# Patient Record
Sex: Female | Born: 1935 | Race: White | Hispanic: No | Smoking: Never smoker
Health system: Southern US, Community
[De-identification: ages and names within clinical notes are randomized; demographics above are authoritative.]

## PROBLEM LIST (undated history)

## (undated) DIAGNOSIS — I10 Essential (primary) hypertension: Secondary | ICD-10-CM

## (undated) DIAGNOSIS — E785 Hyperlipidemia, unspecified: Secondary | ICD-10-CM

## (undated) DIAGNOSIS — H4450 Unspecified degenerated conditions of globe: Secondary | ICD-10-CM

## (undated) HISTORY — PX: EYE SURGERY: SHX253

---

## 2003-05-08 ENCOUNTER — Ambulatory Visit (HOSPITAL_COMMUNITY): Admission: RE | Admit: 2003-05-08 | Discharge: 2003-05-08 | Payer: Self-pay | Admitting: Ophthalmology

## 2007-12-14 ENCOUNTER — Encounter (INDEPENDENT_AMBULATORY_CARE_PROVIDER_SITE_OTHER): Payer: Self-pay | Admitting: Family Medicine

## 2007-12-14 ENCOUNTER — Ambulatory Visit: Payer: Self-pay | Admitting: Cardiology

## 2007-12-14 ENCOUNTER — Ambulatory Visit (HOSPITAL_COMMUNITY): Admission: RE | Admit: 2007-12-14 | Discharge: 2007-12-14 | Payer: Self-pay | Admitting: Family Medicine

## 2010-11-03 ENCOUNTER — Encounter: Payer: Self-pay | Admitting: *Deleted

## 2010-11-03 ENCOUNTER — Emergency Department (HOSPITAL_COMMUNITY)
Admission: EM | Admit: 2010-11-03 | Discharge: 2010-11-03 | Disposition: A | Discharge status: Home / Self Care | Payer: Medicare Other | Attending: Emergency Medicine | Admitting: Emergency Medicine

## 2010-11-03 ENCOUNTER — Emergency Department (HOSPITAL_COMMUNITY): Payer: Medicare Other

## 2010-11-03 DIAGNOSIS — M25539 Pain in unspecified wrist: Secondary | ICD-10-CM | POA: Insufficient documentation

## 2010-11-03 DIAGNOSIS — I10 Essential (primary) hypertension: Secondary | ICD-10-CM | POA: Insufficient documentation

## 2010-11-03 DIAGNOSIS — S62109A Fracture of unspecified carpal bone, unspecified wrist, initial encounter for closed fracture: Secondary | ICD-10-CM

## 2010-11-03 DIAGNOSIS — Z79899 Other long term (current) drug therapy: Secondary | ICD-10-CM | POA: Insufficient documentation

## 2010-11-03 DIAGNOSIS — W19XXXA Unspecified fall, initial encounter: Secondary | ICD-10-CM | POA: Insufficient documentation

## 2010-11-03 HISTORY — DX: Unspecified degenerated conditions of globe: H44.50

## 2010-11-03 MED ORDER — HYDROCODONE-ACETAMINOPHEN 5-325 MG PO TABS: 1.0000 | ORAL_TABLET | Freq: Four times a day (QID) | ORAL | Status: DC | PRN

## 2010-11-03 NOTE — ED Provider Notes (Signed)
Scribed for Tonya Lennert, MD, the patient was seen in room APA16A/APA16A . This chart was scribed by Ellie Lunch. This patient's care was started at 9:05 PM.   CSN: [id number] Arrival date & time: 11/03/2010  7:51 PM  Chief Complaint  Patient presents with  . Fall  . Wrist Pain    (Consider location/radiation/quality/duration/timing/severity/associated sxs/prior treatment) HPI PT seen at  Tonya Henderson is a 77 y.o. female who presents to the Emergency Department complaining of right wrist injury after falling. Pt reports she fell and landed on her right wrist about an hour ago. C/o associated pain and swelling. Pain is severe and constant since onset. Pt treated wrist with ice with littie improvement. Pt denies any other pain or injury related to the fall.     Past Medical History  Diagnosis Date  . Hypertension   . CHF (congestive heart failure)   . Degenerated eye     Past Surgical History  Procedure Date  . Eye surgery     History reviewed. No pertinent family history.  History  Substance Use Topics  . Smoking status: Never Smoker   . Smokeless tobacco: Not on file  . Alcohol Use: No    Review of Systems  Constitutional: Negative for fatigue.  HENT: Negative for congestion, sinus pressure and ear discharge.   Eyes: Negative for discharge.  Respiratory: Negative for cough.   Cardiovascular: Negative for chest pain.  Gastrointestinal: Negative for abdominal pain and diarrhea.  Genitourinary: Negative for frequency and hematuria.  Musculoskeletal: Negative for back pain.       Right wrist injury.  Skin: Negative for rash.  Neurological: Negative for seizures and headaches.  Hematological: Negative.   Psychiatric/Behavioral: Negative for hallucinations.    Allergies  Review of patient's allergies indicates no known allergies.  Home Medications   Current Outpatient Rx  Name Route Sig Dispense Refill  . AMLODIPINE BESYLATE 10 MG PO TABS Oral Take 10 mg  by mouth daily.      . ASPIRIN EC 81 MG PO TBEC Oral Take 81 mg by mouth daily.      Marland Kitchen FOLIC ACID 1 MG PO TABS Oral Take 1 mg by mouth daily.      Marland Kitchen NIACIN (ANTIHYPERLIPIDEMIC) 500 MG PO TBCR Oral Take 500 mg by mouth at bedtime.      Marland Kitchen OLMESARTAN MEDOXOMIL-HCTZ 20-12.5 MG PO TABS Oral Take 1 tablet by mouth daily.      Marland Kitchen ROSUVASTATIN CALCIUM 20 MG PO TABS Oral Take 20 mg by mouth daily.        BP 200/72  Pulse 77  Resp 18  Ht 5\' 2"  (1.575 m)  Wt 146 lb (66.225 kg)  BMI 26.70 kg/m2  SpO2 96%  Physical Exam  Constitutional: She is oriented to person, place, and time. She appears well-developed.  HENT:  Head: Normocephalic.  Eyes: Conjunctivae are normal.  Neck: No tracheal deviation present.  Musculoskeletal: Normal range of motion.       Deformity to right wrist noted. Neurovascularly right wrist is intact. Good pulses.  Neurological: She is oriented to person, place, and time.  Skin: Skin is warm.  Psychiatric: She has a normal mood and affect.   Deformity to right wrist. Good pulses  Procedures (including critical care time)  OTHER DATA REVIEWED: Nursing notes, vital signs, and past medical records reviewed.  DIAGNOSTIC STUDIES: Oxygen Saturation is 96% on room air, normal by my interpretation.    LABS / RADIOLOGY:  Dg Wrist  Complete Right  11/03/2010  *RADIOLOGY REPORT*  Clinical Data: Larey Seat.  Right wrist pain.  RIGHT WRIST - COMPLETE 3+ VIEW  Comparison: None  Findings: There is a comminuted intra-articular fracture distal radius with marked dorsal displacement.  There is an associated ulnar styloid avulsion fracture.  The carpal and metacarpal bones are intact.  IMPRESSION:  1.  Comminuted intra-articular fracture distal radius with marked dorsal displacement and impaction. 2.  Ulnar styloid avulsion fracture.  Original Report Authenticated By: P. Loralie Champagne, M.D.   ED COURSE / COORDINATION OF CARE: 21:07 EDP at PT bedside. Discussed plan to apply sugar tom sling,  treat for pain management.      SCRIBE ATTEST The chart was scribed for me under my direct supervision.  I personally performed the history, physical, and medical decision making and all procedures in the evaluation of this patient.Tonya Lennert, MD 11/03/10 2115

## 2010-11-03 NOTE — ED Notes (Signed)
MD at bedside. 

## 2010-11-03 NOTE — ED Notes (Signed)
Patient fell and went to catch self and landed on right wrist, rt wrist swollen, pt applied ice PTA

## 2010-11-03 NOTE — ED Notes (Signed)
Transported to xray 

## 2010-11-03 NOTE — ED Notes (Signed)
Swelling noted to right wrist

## 2010-11-03 NOTE — ED Notes (Signed)
See triage note for further

## 2010-11-04 ENCOUNTER — Encounter: Payer: Self-pay | Admitting: Orthopedic Surgery

## 2010-11-04 ENCOUNTER — Ambulatory Visit (INDEPENDENT_AMBULATORY_CARE_PROVIDER_SITE_OTHER): Payer: Medicare Other | Admitting: Orthopedic Surgery

## 2010-11-04 VITALS — BP 138/58 | Ht 62.0 in | Wt 143.0 lb

## 2010-11-04 DIAGNOSIS — S62109A Fracture of unspecified carpal bone, unspecified wrist, initial encounter for closed fracture: Secondary | ICD-10-CM

## 2010-11-04 NOTE — Patient Instructions (Addendum)
You have been scheduled for surgery.  All surgeries carry some risk.  Remember you always have the option of continued nonsurgical treatment. However in this situation the risks vs. the benefits favor surgery as the best treatment option. The risks of the surgery includes the following but is not limited to bleeding, infection, pulmonary embolus, death from anesthesia, nerve injury vascular injury or need for further surgery, continued pain.  Specific to this procedure the following risks and complications are rare but possible Stiffness, mild pain related to the weather    You have been scheduled for surgery  Please Go to your preoperative appointment and bring the folder that was given to you today  Please stop all blood thinners ibuprofen Naprosyn aspirin Plavix Coumadin

## 2010-11-04 NOTE — Progress Notes (Signed)
New patient  ER referral  Tonya Henderson is a 77 y.o. female who presents to the Emergency Department complaining of right wrist injury after falling. Pt reports she fell and landed on her right wrist about an hour ago. C/o associated pain and swelling. Pain is severe and constant since onset. Pt treated wrist with ice with littie improvement. Pt denies any other pain or injury related to the fall.   ROS: negative except for easy bruising   Past Medical History  Diagnosis Date  . Hypertension   . CHF (congestive heart failure)   . Degenerated eye   . High cholesterol   . Enlarged heart     Past Surgical History  Procedure Date  . Eye surgery     History   Social History  . Marital Status: Widowed    Spouse Name: N/A    Number of Children: N/A  . Years of Education: 7th grade   Occupational History  . ?    Social History Main Topics  . Smoking status: Never Smoker   . Smokeless tobacco: Not on file  . Alcohol Use: No  . Drug Use: No  . Sexually Active: Not on file   Other Topics Concern  . Not on file   Social History Narrative  . No narrative on file   Physical Exam(12) GENERAL: normal development   CDV: pulses are normal   Skin: normal  Lymph: nodes were not palpable/normal  Psychiatric: awake, alert and oriented  Neuro: normal sensation  MSK Ambulation normal   1 right wrist pain, tenderness and deformity 2 motor exam is normal  3 stability is normal at the wrist joint ( by x rays)  4 ROM: defer exam 2nd to pain  5 Lower extremity exam:   Inspection and palpation revealed no tenderness or abnormality in alignment in the lower extremities. Range of motion is full.  Strength is grade 5.   Assessment:  Xrays show dorsal angulated and impacted right distal radius fracture     Plan:  OTIF right wrist with DVR plate

## 2010-11-05 ENCOUNTER — Encounter (HOSPITAL_COMMUNITY)
Admission: RE | Admit: 2010-11-05 | Discharge: 2010-11-05 | Disposition: A | Discharge status: Home / Self Care | Payer: Medicare Other | Source: Ambulatory Visit | Attending: Orthopedic Surgery | Admitting: Orthopedic Surgery

## 2010-11-05 ENCOUNTER — Encounter (HOSPITAL_COMMUNITY): Payer: Self-pay

## 2010-11-05 ENCOUNTER — Other Ambulatory Visit: Payer: Self-pay

## 2010-11-05 ENCOUNTER — Telehealth: Payer: Self-pay | Admitting: Orthopedic Surgery

## 2010-11-05 LAB — CBC
MCH: 28.9 pg (ref 26.0–34.0)
Platelets: 194 10*3/uL (ref 150–400)
RBC: 3.88 MIL/uL (ref 3.87–5.11)

## 2010-11-05 LAB — BASIC METABOLIC PANEL
GFR calc Af Amer: 46 mL/min — ABNORMAL LOW (ref >90)
GFR calc non Af Amer: 40 mL/min — ABNORMAL LOW (ref >90)
Glucose, Bld: 133 mg/dL — ABNORMAL HIGH (ref 70–99)
Potassium: 3.6 mEq/L (ref 3.5–5.1)
Sodium: 139 mEq/L (ref 135–145)

## 2010-11-05 LAB — SURGICAL PCR SCREEN
MRSA, PCR: NEGATIVE
Staphylococcus aureus: NEGATIVE

## 2010-11-05 LAB — DIFFERENTIAL
Basophils Relative: 0 % (ref 0–1)
Eosinophils Absolute: 0.1 10*3/uL (ref 0.0–0.7)
Lymphs Abs: 2.2 10*3/uL (ref 0.7–4.0)
Neutro Abs: 6.2 10*3/uL (ref 1.7–7.7)
Neutrophils Relative %: 63 % (ref 43–77)

## 2010-11-05 NOTE — Telephone Encounter (Signed)
Contacted insurer, [employer] at ph# [phone], re: open treatment with internal fixation, right wrist, CPT  scheduled at [facility] 11/06/10. Per Fredrich Romans, no pre-authorization required for out-patient surgery for in-network providers.

## 2010-11-05 NOTE — Patient Instructions (Addendum)
20 SARAMARIE STINGER  11/05/2010   Your procedure is scheduled on:  11/06/2010  Report to [facility] at  1100 AM.  Call this number if you have problems the morning of surgery: [phone]   Remember:   Do not eat food:After Midnight.  Do not drink clear liquids: After Midnight.  Take these medicines the morning of surgery with A SIP OF WATER: Norco,Norvasc,benicar   Do not wear jewelry, make-up or nail polish.  Do not wear lotions, powders, or perfumes. You may wear deodorant.  Do not shave 48 hours prior to surgery.  Do not bring valuables to the hospital.  Contacts, dentures or bridgework may not be worn into surgery.  Leave suitcase in the car. After surgery it may be brought to your room.  For patients admitted to the hospital, checkout time is 11:00 AM the day of discharge.   Patients discharged the day of surgery will not be allowed to drive home.  Name and phone number of your driver: family  Special Instructions: CHG Shower Use Special Wash: 1/2 bottle night before surgery and 1/2 bottle morning of surgery.   Please read over the following fact sheets that you were given: Pain Booklet, MRSA Information, Surgical Site Infection Prevention, Anesthesia Post-op Instructions and Care and Recovery After Surgery PATIENT INSTRUCTIONS POST-ANESTHESIA  IMMEDIATELY FOLLOWING SURGERY:  Do not drive or operate machinery for the first twenty four hours after surgery.  Do not make any important decisions for twenty four hours after surgery or while taking narcotic pain medications or sedatives.  If you develop intractable nausea and vomiting or a severe headache please notify your doctor immediately.  FOLLOW-UP:  Please make an appointment with your surgeon as instructed. You do not need to follow up with anesthesia unless specifically instructed to do so.  WOUND CARE INSTRUCTIONS (if applicable):  Keep a dry clean dressing on the anesthesia/puncture wound site if there is drainage.  Once the  wound has quit draining you may leave it open to air.  Generally you should leave the bandage intact for twenty four hours unless there is drainage.  If the epidural site drains for more than 36-48 hours please call the anesthesia department.  QUESTIONS?:  Please feel free to call your physician or the hospital operator if you have any questions, and they will be happy to assist you.     [facility] Anesthesia Department [address] [place] [phone]

## 2010-11-06 ENCOUNTER — Ambulatory Visit (HOSPITAL_COMMUNITY)
Admission: RE | Admit: 2010-11-06 | Discharge: 2010-11-06 | Disposition: A | Discharge status: Home / Self Care | Payer: Medicare Other | Source: Ambulatory Visit | Attending: Orthopedic Surgery | Admitting: Orthopedic Surgery

## 2010-11-06 ENCOUNTER — Ambulatory Visit (HOSPITAL_COMMUNITY): Payer: Medicare Other

## 2010-11-06 ENCOUNTER — Ambulatory Visit (HOSPITAL_COMMUNITY): Payer: Medicare Other | Admitting: Anesthesiology

## 2010-11-06 ENCOUNTER — Encounter (HOSPITAL_COMMUNITY)
Admission: RE | Disposition: A | Discharge status: Home / Self Care | Payer: Self-pay | Source: Ambulatory Visit | Attending: Orthopedic Surgery

## 2010-11-06 ENCOUNTER — Encounter (HOSPITAL_COMMUNITY): Payer: Self-pay | Admitting: Anesthesiology

## 2010-11-06 DIAGNOSIS — I10 Essential (primary) hypertension: Secondary | ICD-10-CM | POA: Insufficient documentation

## 2010-11-06 DIAGNOSIS — Z79899 Other long term (current) drug therapy: Secondary | ICD-10-CM | POA: Insufficient documentation

## 2010-11-06 DIAGNOSIS — Z01812 Encounter for preprocedural laboratory examination: Secondary | ICD-10-CM | POA: Insufficient documentation

## 2010-11-06 DIAGNOSIS — S52539A Colles' fracture of unspecified radius, initial encounter for closed fracture: Secondary | ICD-10-CM

## 2010-11-06 DIAGNOSIS — Z7982 Long term (current) use of aspirin: Secondary | ICD-10-CM | POA: Insufficient documentation

## 2010-11-06 DIAGNOSIS — Z01810 Encounter for preprocedural cardiovascular examination: Secondary | ICD-10-CM | POA: Insufficient documentation

## 2010-11-06 DIAGNOSIS — S52531A Colles' fracture of right radius, initial encounter for closed fracture: Secondary | ICD-10-CM

## 2010-11-06 DIAGNOSIS — W19XXXA Unspecified fall, initial encounter: Secondary | ICD-10-CM | POA: Insufficient documentation

## 2010-11-06 HISTORY — PX: ORIF WRIST FRACTURE: SHX2133

## 2010-11-06 SURGERY — OPEN REDUCTION INTERNAL FIXATION (ORIF) WRIST FRACTURE
Anesthesia: General | Site: Wrist | Laterality: Right | Wound class: Clean

## 2010-11-06 MED ORDER — SODIUM CHLORIDE 0.9 % IR SOLN
Status: DC | PRN
  Administered 2010-11-06: 1000 mL

## 2010-11-06 MED ORDER — FENTANYL CITRATE 0.05 MG/ML IJ SOLN
INTRAMUSCULAR | Status: AC
  Administered 2010-11-06: 50 ug via INTRAVENOUS
  Filled 2010-11-06: qty 2

## 2010-11-06 MED ORDER — CEFAZOLIN SODIUM 1-5 GM-% IV SOLN
INTRAVENOUS | Status: AC
  Filled 2010-11-06: qty 50

## 2010-11-06 MED ORDER — LIDOCAINE HCL 1 % IJ SOLN
INTRAMUSCULAR | Status: DC | PRN
  Administered 2010-11-06: 10 mg via INTRADERMAL

## 2010-11-06 MED ORDER — MIDAZOLAM HCL 2 MG/2ML IJ SOLN
INTRAMUSCULAR | Status: AC
  Administered 2010-11-06: 1 mg via INTRAVENOUS
  Filled 2010-11-06: qty 2

## 2010-11-06 MED ORDER — LACTATED RINGERS IV SOLN
INTRAVENOUS | Status: DC
  Administered 2010-11-06: 12:00:00 via INTRAVENOUS

## 2010-11-06 MED ORDER — LIDOCAINE HCL (PF) 1 % IJ SOLN
INTRAMUSCULAR | Status: AC
  Filled 2010-11-06: qty 5

## 2010-11-06 MED ORDER — FENTANYL CITRATE 0.05 MG/ML IJ SOLN
25.0000 ug | INTRAMUSCULAR | Status: DC | PRN
  Administered 2010-11-06 (×2): 50 ug via INTRAVENOUS

## 2010-11-06 MED ORDER — PROPOFOL 10 MG/ML IV EMUL
INTRAVENOUS | Status: DC | PRN
  Administered 2010-11-06: 20 mg via INTRAVENOUS
  Administered 2010-11-06: 100 mg via INTRAVENOUS

## 2010-11-06 MED ORDER — MIDAZOLAM HCL 2 MG/2ML IJ SOLN
1.0000 mg | INTRAMUSCULAR | Status: DC | PRN
  Administered 2010-11-06 (×2): 1 mg via INTRAVENOUS

## 2010-11-06 MED ORDER — EPHEDRINE SULFATE 50 MG/ML IJ SOLN
INTRAMUSCULAR | Status: DC | PRN
  Administered 2010-11-06: 10 mg via INTRAVENOUS

## 2010-11-06 MED ORDER — CEFAZOLIN SODIUM 1-5 GM-% IV SOLN
1.0000 g | INTRAVENOUS | Status: AC
  Administered 2010-11-06: 1 g via INTRAVENOUS

## 2010-11-06 MED ORDER — FENTANYL CITRATE 0.05 MG/ML IJ SOLN
INTRAMUSCULAR | Status: DC | PRN
  Administered 2010-11-06 (×4): 25 ug via INTRAVENOUS

## 2010-11-06 MED ORDER — HYDROCODONE-ACETAMINOPHEN 5-325 MG PO TABS: 1.0000 | ORAL_TABLET | Freq: Four times a day (QID) | ORAL | Status: AC | PRN

## 2010-11-06 MED ORDER — PROPOFOL 10 MG/ML IV EMUL
INTRAVENOUS | Status: AC
  Filled 2010-11-06: qty 20

## 2010-11-06 MED ORDER — EPHEDRINE SULFATE 50 MG/ML IJ SOLN
INTRAMUSCULAR | Status: AC
  Filled 2010-11-06: qty 1

## 2010-11-06 MED ORDER — BUPIVACAINE-EPINEPHRINE PF 0.5-1:200000 % IJ SOLN
INTRAMUSCULAR | Status: AC
  Filled 2010-11-06: qty 10

## 2010-11-06 MED ORDER — GLYCOPYRROLATE 0.2 MG/ML IJ SOLN
INTRAMUSCULAR | Status: DC | PRN
  Administered 2010-11-06: .2 mg via INTRAVENOUS

## 2010-11-06 MED ORDER — ONDANSETRON HCL 4 MG/2ML IJ SOLN: 4.0000 mg | Freq: Once | INTRAMUSCULAR | Status: DC | PRN

## 2010-11-06 MED ORDER — BUPIVACAINE-EPINEPHRINE PF 0.5-1:200000 % IJ SOLN
INTRAMUSCULAR | Status: DC | PRN
  Administered 2010-11-06: 20 mL

## 2010-11-06 SURGICAL SUPPLY — 67 items
BAG HAMPER (MISCELLANEOUS) ×2 IMPLANT
BANDAGE ELASTIC 3 VELCRO NS (GAUZE/BANDAGES/DRESSINGS) ×2 IMPLANT
BANDAGE ELASTIC 3 VELCRO ST LF (GAUZE/BANDAGES/DRESSINGS) IMPLANT
BANDAGE ELASTIC 4 VELCRO NS (GAUZE/BANDAGES/DRESSINGS) ×2 IMPLANT
BANDAGE ESMARK 4X12 BL STRL LF (DISPOSABLE) ×1 IMPLANT
BIT DRILL 2.0X128 (BIT) ×2 IMPLANT
BIT DRILL 2.5X4 QC (BIT) ×2 IMPLANT
BLADE SURG SZ10 CARB STEEL (BLADE) ×2 IMPLANT
BNDG COHESIVE 4X5 TAN NS LF (GAUZE/BANDAGES/DRESSINGS) ×2 IMPLANT
BNDG ESMARK 4X12 BLUE STRL LF (DISPOSABLE) ×2
CAP PIN PROTECTOR ORTHO WHT (CAP) IMPLANT
CHLORAPREP W/TINT 26ML (MISCELLANEOUS) ×2 IMPLANT
CLOTH BEACON ORANGE TIMEOUT ST (SAFETY) ×2 IMPLANT
COVER LIGHT HANDLE STERIS (MISCELLANEOUS) ×4 IMPLANT
COVER PROBE W GEL 5X96 (DRAPES) IMPLANT
CUFF TOURNIQUET SINGLE 18IN (TOURNIQUET CUFF) ×2 IMPLANT
DRAPE C-ARM FOLDED MOBILE STRL (DRAPES) ×2 IMPLANT
DRAPE PROXIMA HALF (DRAPES) ×2 IMPLANT
DRSG XEROFORM 1X8 (GAUZE/BANDAGES/DRESSINGS) ×2 IMPLANT
ELECT REM PT RETURN 9FT ADLT (ELECTROSURGICAL) ×2
ELECTRODE REM PT RTRN 9FT ADLT (ELECTROSURGICAL) ×1 IMPLANT
GLOVE BIOGEL PI IND STRL 7.0 (GLOVE) ×2 IMPLANT
GLOVE BIOGEL PI INDICATOR 7.0 (GLOVE) ×2
GLOVE ECLIPSE 6.5 STRL STRAW (GLOVE) ×2 IMPLANT
GLOVE EXAM NITRILE XS STR PU (GLOVE) ×2 IMPLANT
GLOVE OPTIFIT SS 6.5 STRL BRWN (GLOVE) ×2 IMPLANT
GLOVE SKINSENSE NS SZ8.0 LF (GLOVE) ×1
GLOVE SKINSENSE STRL SZ8.0 LF (GLOVE) ×1 IMPLANT
GLOVE SS N UNI LF 8.5 STRL (GLOVE) ×4 IMPLANT
GOWN BRE IMP SLV AUR XL STRL (GOWN DISPOSABLE) ×4 IMPLANT
GOWN STRL REIN XL XLG (GOWN DISPOSABLE) ×2 IMPLANT
INST SET MINOR BONE (KITS) ×2 IMPLANT
K-WIRE 229MX1.6 (WIRE) IMPLANT
KIT ROOM TURNOVER APOR (KITS) ×2 IMPLANT
MANIFOLD NEPTUNE II (INSTRUMENTS) IMPLANT
NEEDLE HYPO 21X1.5 SAFETY (NEEDLE) ×2 IMPLANT
NS IRRIG 1000ML POUR BTL (IV SOLUTION) ×2 IMPLANT
PACK BASIC LIMB (CUSTOM PROCEDURE TRAY) ×2 IMPLANT
PAD ARMBOARD 7.5X6 YLW CONV (MISCELLANEOUS) ×2 IMPLANT
PAD CAST 4YDX4 CTTN HI CHSV (CAST SUPPLIES) ×1 IMPLANT
PADDING CAST COTTON 4X4 STRL (CAST SUPPLIES) ×1
PEG FULLY THREADED 2.5X22MM (Peg) ×2 IMPLANT
PEG SUBCHONDRAL SMOOTH 2.0X20 (Peg) ×2 IMPLANT
PEG SUBCHONDRAL SMOOTH 2.0X24 (Peg) ×4 IMPLANT
PEG THREADED 2.5MMX22MM LONG (Peg) ×2 IMPLANT
PEG THREADED 2.5MMX24MM LONG (Peg) ×4 IMPLANT
PIN CAPS ORTHO GREEN .062 (PIN) IMPLANT
PLATE SHORT 24.4X51.3 RT (Plate) ×2 IMPLANT
SCREW BN 12X3.5XNS CORT TI (Screw) ×2 IMPLANT
SCREW CORT 3.5X10 LNG (Screw) ×2 IMPLANT
SCREW CORT 3.5X12 (Screw) ×2 IMPLANT
SET BASIN LINEN APH (SET/KITS/TRAYS/PACK) ×2 IMPLANT
SPLINT IMMOBILIZER J 3INX20FT (CAST SUPPLIES)
SPLINT J IMMOBILIZER 3X20FT (CAST SUPPLIES) IMPLANT
SPLINT J IMMOBILIZER 4X20FT (CAST SUPPLIES) ×1 IMPLANT
SPLINT J PLASTER J 4INX20Y (CAST SUPPLIES) ×1
SPONGE GAUZE 4X4 12PLY (GAUZE/BANDAGES/DRESSINGS) ×2 IMPLANT
SPONGE LAP 4X18 X RAY DECT (DISPOSABLE) IMPLANT
STAPLER VISISTAT 35W (STAPLE) ×2 IMPLANT
SUT ETHILON 3 0 FSL (SUTURE) ×2 IMPLANT
SUT MON AB 2-0 CT1 36 (SUTURE) ×4 IMPLANT
SUT MON AB 2-0 SH 27 (SUTURE)
SUT MON AB 2-0 SH27 (SUTURE) IMPLANT
SYR BULB IRRIGATION 50ML (SYRINGE) ×4 IMPLANT
SYR CONTROL 10ML LL (SYRINGE) ×2 IMPLANT
WATER STERILE IRR 1000ML POUR (IV SOLUTION) ×4 IMPLANT
YANKAUER SUCT 12FT TUBE ARGYLE (SUCTIONS) ×2 IMPLANT

## 2010-11-06 NOTE — Interval H&P Note (Signed)
History and Physical Interval Note:   11/06/2010   12:32 PM   Tonya Henderson  has presented today for surgery, with the diagnosis of right wrist fx  The various methods of treatment have been discussed with the patient and family. After consideration of risks, benefits and other options for treatment, the patient has consented to  Procedure(s): OPEN REDUCTION INTERNAL FIXATION (ORIF) WRIST FRACTURE as a surgical intervention .  I have reviewed the patients' chart and labs.  Questions were answered to the patient's satisfaction.     Fuller Canada  MD

## 2010-11-06 NOTE — Interval H&P Note (Signed)
History and Physical Interval Note:   11/06/2010   12:37 PM   Tonya Henderson  has presented today for surgery, with the diagnosis of right wrist fx  The various methods of treatment have been discussed with the patient and family. After consideration of risks, benefits and other options for treatment, the patient has consented to  Procedure(s):right  OPEN REDUCTION INTERNAL FIXATION (ORIF) WRIST FRACTURE as a surgical intervention .  I have reviewed the patients' chart and labs.  Questions were answered to the patient's satisfaction.    H&P guidelines Update  The H/P was reviewed, the patient was examined, and there is no change in the patient's condition since the original H/P was completed.  Per Joint commission requirements   Terrance Mass., MD    Fuller Canada  MD

## 2010-11-06 NOTE — Progress Notes (Signed)
Awakens easily to name. Rates pain 0. Rt fingers swollen/ecchymotic. Rt arm elevated on pillow. Ice bag to rt forearm. Moves rt fingers on command.

## 2010-11-06 NOTE — Progress Notes (Signed)
Awake. C/O postop rt arm pain. Rates pain 7. Med as noted.

## 2010-11-06 NOTE — Anesthesia Preprocedure Evaluation (Addendum)
Anesthesia Evaluation  Name, MR# and DOB Patient awake  General Assessment Comment  Reviewed: Allergy & Precautions, H&P , NPO status , Patient's Chart, lab work & pertinent test results, reviewed documented beta blocker date and time   History of Anesthesia Complications Negative for: history of anesthetic complications  Airway Mallampati: II  Neck ROM: Full    Dental  (+) Edentulous Upper and Edentulous Lower   Pulmonary    Pulmonary exam normal       Cardiovascular hypertension, Pt. on medications +CHF Regular Normal    Neuro/Psych    GI/Hepatic negative GI ROS   Endo/Other    Renal/GU      Musculoskeletal   Abdominal   Peds  Hematology   Anesthesia Other Findings   Reproductive/Obstetrics                           Anesthesia Physical Anesthesia Plan  ASA: III  Anesthesia Plan: General   Post-op Pain Management:    Induction: Intravenous  Airway Management Planned: LMA  Additional Equipment:   Intra-op Plan:   Post-operative Plan: Extubation in OR  Informed Consent: I have reviewed the patients History and Physical, chart, labs and discussed the procedure including the risks, benefits and alternatives for the proposed anesthesia with the patient or authorized representative who has indicated his/her understanding and acceptance.     Plan Discussed with:   Anesthesia Plan Comments:         Anesthesia Quick Evaluation

## 2010-11-06 NOTE — Anesthesia Procedure Notes (Addendum)
Procedure Name: LMA Insertion Date/Time: 11/06/2010 12:46 PM Performed by: Minerva Areola Pre-anesthesia Checklist: Patient identified, Patient being monitored, Emergency Drugs available, Timeout performed and Suction available Patient Re-evaluated:Patient Re-evaluated prior to inductionOxygen Delivery Method: Circle System Utilized Preoxygenation: Pre-oxygenation with 100% oxygen Intubation Type: IV induction Ventilation: Mask ventilation without difficulty LMA: LMA inserted LMA Size: 3.0 Number of attempts: 1 Placement Confirmation: positive ETCO2 and breath sounds checked- equal and bilateral Tube secured with: Tape

## 2010-11-06 NOTE — Transfer of Care (Signed)
Immediate Anesthesia Transfer of Care Note  Patient: Tonya Henderson  Procedure(s) Performed:  OPEN REDUCTION INTERNAL FIXATION (ORIF) WRIST FRACTURE - With DVR Plates  Patient Location: PACU  Anesthesia Type: General  Level of Consciousness: awake, alert  and oriented  Airway & Oxygen Therapy: Patient Spontanous Breathing and Patient connected to face mask oxygen  Post-op Assessment: Report given to PACU RN  Post vital signs: stable  Complications: No apparent anesthesia complications

## 2010-11-06 NOTE — Op Note (Signed)
Surgical date 11/06/2010  Preop diagnosis closed fracture right distal radius, Colles' fracture Postop diagnosis same Procedure open reduction internal fixation with DVR plate, volar. Surgeon Romeo Apple Anesthetic Gen. Operative findings three-part distal radius fracture displaced and angulated Implant DVR hand innovations volar wrist plate 3 proximal threaded pegs, for a distal smooth pegs, 3-3.5 cortical screws Details of procedure:  The patient was identified in the preop area to her right hand was marked as a surgical site the patient was taken to the operating room given a gram of Ancef, she was given general anesthetic she was in the supine position. The wrist and hand and arm were prepped and draped sterilely with a tourniquet applied proximally  After time out, the right upper extremity was exsanguinated with a 4 inch Esmarch followed by elevation of tourniquet to 250 mmHg.  A zigzag incision was made over the wrist and extended proximally on the volar aspect of the forearm. The subcutaneous tissue was divided. The flexor carpi radialis tendon was identified the tendon was opened and followed distally to the trapezium. The floor of the tendon was explored until the quadratus was identified it was subperiosteally removed from the bone and taken ulnar. The first extensor compartment was opened the floor of it was used to locate the brachial radialis which was released. Fracture was reduced with manual reduction maneuvers. The plate was applied to the bone in the slotted oh to 3.5 screw using a 2.5 drill bit.  Radiographs were used to adjust the plate and then the proximal ulnar HOLES WERE  drilled and we proceeded radially until the proximal row was filled with threaded pegs  The distal row was then drilled and filled with smooth PEGS  Radiographs confirmed reduction and hardware placement  The wrist had full range of motion as did the forearm  The wound is irrigated closed with 2-0  Monocryl sutures and staples. We injected 20 cc of Marcaine with epinephrine and the subcutaneous tissue  A volar splint was placed with the wrist in neutral position.

## 2010-11-06 NOTE — Brief Op Note (Signed)
11/06/2010  2:34 PM  PATIENT:  Tonya Henderson  77 y.o. female  PRE-OPERATIVE DIAGNOSIS:  right wrist fracture  POST-OPERATIVE DIAGNOSIS:  right wrist fracture  PROCEDURE:  Procedure(s): OPEN REDUCTION INTERNAL FIXATION right (ORIF) WRIST FRACTURE  SURGEON:  Surgeon(s): Fuller Canada, MD  PHYSICIAN ASSISTANT:   ASSISTANTS: BETTY ASHLEY    ANESTHESIA:   general  OR FLUID I/O:  Total I/O In: 700 [I.V.:700] Out: 0   BLOOD ADMINISTERED:none  DRAINS: none   LOCAL MEDICATIONS USED:  MARCAINE 20 [WITH EPI]CC  SPECIMEN:  No Specimen  DISPOSITION OF SPECIMEN:  N/A  COUNTS:  YES  TOURNIQUET:  * Missing tourniquet times found for documented tourniquets in log:  4301 *  DICTATION: .Dragon Dictation  PLAN OF CARE: Discharge to home after PACU  PATIENT DISPOSITION:  PACU - hemodynamically stable.   Delay start of Pharmacological VTE agent (>24hrs) due to surgical blood loss or risk of bleeding:  not applicable

## 2010-11-06 NOTE — Anesthesia Postprocedure Evaluation (Signed)
  Anesthesia Post-op Note  Patient: Tonya Henderson  Procedure(s) Performed:  OPEN REDUCTION INTERNAL FIXATION (ORIF) WRIST FRACTURE - With DVR Plates  Patient Location: PACU  Anesthesia Type: General  Level of Consciousness: awake and alert   Airway and Oxygen Therapy: Patient Spontanous Breathing and Patient connected to face mask oxygen  Post-op Pain: none  Post-op Assessment: Post-op Vital signs reviewed, Patient's Cardiovascular Status Stable, Respiratory Function Stable and Patent Airway  Post-op Vital Signs: Reviewed and stable  Complications: No apparent anesthesia complications

## 2010-11-06 NOTE — H&P (Addendum)
Tonya Henderson   11/04/2010 3:15 PM Office Visit  MRN: [id number]   Description: 77 year old female  Provider: Fuller Canada, MD  Department: [facility]        Diagnoses     Wrist fracture, closed   - Primary    814.00      Reason for Visit     Arm Pain    New patient, right arm fracture, DOI 11/03/10, xrays at [facility] 11/03/10.        Vitals - Last Recorded       BP Ht Wt BMI    138/58  5\' 2"  (1.575 m)  143 lb (64.864 kg)  26.15 kg/m2          Progress Notes     Fuller Canada, MD  11/04/2010  3:40 PM  Signed New patient   ER referral   Tonya Henderson is a 77 y.o. female who presents to the Emergency Department complaining of right wrist injury after falling. Pt reports she fell and landed on her right wrist about an hour ago. C/o associated pain and swelling. Pain is severe and constant since onset. Pt treated wrist with ice with littie improvement. Pt denies any other pain or injury related to the fall.    ROS: negative except for easy bruising     Past Medical History   Diagnosis  Date   .  Hypertension     .  CHF (congestive heart failure)     .  Degenerated eye     .  High cholesterol     .  Enlarged heart         Past Surgical History   Procedure  Date   .  Eye surgery         History       Social History   .  Marital Status:  Widowed       Spouse Name:  N/A       Number of Children:  N/A   .  Years of Education:  7th grade       Occupational History   .  ?         Social History Main Topics   .  Smoking status:  Never Smoker    .  Smokeless tobacco:  Not on file   .  Alcohol Use:  No   .  Drug Use:  No   .  Sexually Active:  Not on file       Other Topics  Concern   .  Not on file       Social History Narrative   .  No narrative on file    Physical Exam(12) GENERAL: normal development    CDV: pulses are normal    Skin: normal   Lymph: nodes were not palpable/normal   Psychiatric: awake, alert and  oriented   Neuro: normal sensation   MSK Ambulation normal    1 right wrist pain, tenderness and deformity 2 motor exam is normal   3 stability is normal at the wrist joint ( by x rays)   4 ROM: defer exam 2nd to pain   5 Lower extremity exam:    Inspection and palpation revealed no tenderness or abnormality in alignment in the lower extremities. Range of motion is full.  Strength is grade 5.    Assessment:   Xrays show dorsal angulated and impacted right distal radius fracture  Plan:   OTIF right wrist with DVR plate                               Patient Instructions     You have been scheduled for surgery.  All surgeries carry some risk.  Remember you always have the option of continued nonsurgical treatment. However in this situation the risks vs. the benefits favor surgery as the best treatment option. The risks of the surgery includes the following but is not limited to bleeding, infection, pulmonary embolus, death from anesthesia, nerve injury vascular injury or need for further surgery, continued pain.   Specific to this procedure the following risks and complications are rare but possible Stiffness, mild pain related to the weather      You have been scheduled for surgery   Please Go to your preoperative appointment and bring the folder that was given to you today   Please stop all blood thinners ibuprofen Naprosyn aspirin Plavix Coumadin

## 2010-11-08 NOTE — Telephone Encounter (Signed)
CPT [phone] per Dr. Romeo Apple

## 2010-11-11 ENCOUNTER — Ambulatory Visit (INDEPENDENT_AMBULATORY_CARE_PROVIDER_SITE_OTHER): Payer: Medicare Other | Admitting: Orthopedic Surgery

## 2010-11-11 ENCOUNTER — Encounter: Payer: Self-pay | Admitting: Orthopedic Surgery

## 2010-11-11 DIAGNOSIS — Z9889 Other specified postprocedural states: Secondary | ICD-10-CM

## 2010-11-11 DIAGNOSIS — S52539A Colles' fracture of unspecified radius, initial encounter for closed fracture: Secondary | ICD-10-CM

## 2010-11-11 NOTE — Patient Instructions (Signed)
Keep elevated move fingers Keep dry

## 2010-11-11 NOTE — Progress Notes (Signed)
Postop visit #1  Surgical date November 06, 2010  Diagnosis closed RIGHT distal radius fracture  Procedure open reduction internal fixation with DVR volar plate  Splint change  Incision clean  New splint applied  Return for suture removal, x-ray, short arm cast, patient would like purple

## 2010-11-12 ENCOUNTER — Encounter (HOSPITAL_COMMUNITY): Payer: Self-pay | Admitting: Orthopedic Surgery

## 2010-11-21 ENCOUNTER — Ambulatory Visit: Payer: Medicare Other | Admitting: Orthopedic Surgery

## 2010-11-21 ENCOUNTER — Ambulatory Visit (INDEPENDENT_AMBULATORY_CARE_PROVIDER_SITE_OTHER): Payer: Medicare Other | Admitting: Orthopedic Surgery

## 2010-11-21 ENCOUNTER — Encounter: Payer: Self-pay | Admitting: Orthopedic Surgery

## 2010-11-21 VITALS — Ht 62.0 in | Wt 143.0 lb

## 2010-11-21 DIAGNOSIS — S52539A Colles' fracture of unspecified radius, initial encounter for closed fracture: Secondary | ICD-10-CM

## 2010-11-21 NOTE — Patient Instructions (Signed)
Start OT   Wear brace

## 2010-11-21 NOTE — Progress Notes (Signed)
Postop visit.  Date of surgery October 3.  Today patient comes in for wound check and staple and suture removal and x-ray.  X-rays separate report.  3 views RIGHT wrist for fracture followup.  A volar plate is seen transfixing a distal radius fracture, which is in good position. Hardware is unchanged.  Impression stable fixation, RIGHT wrist.  Patient placed in a cockup Velcro wrist splint and sent for occupational therapy to work on metacarpophalangeal joint. Range of motion.

## 2010-11-26 ENCOUNTER — Ambulatory Visit (HOSPITAL_COMMUNITY)
Admission: RE | Admit: 2010-11-26 | Discharge: 2010-11-26 | Disposition: A | Discharge status: Home / Self Care | Payer: Medicare Other | Source: Ambulatory Visit | Attending: Orthopedic Surgery | Admitting: Orthopedic Surgery

## 2010-11-26 DIAGNOSIS — R600 Localized edema: Secondary | ICD-10-CM | POA: Insufficient documentation

## 2010-11-26 DIAGNOSIS — IMO0001 Reserved for inherently not codable concepts without codable children: Secondary | ICD-10-CM | POA: Insufficient documentation

## 2010-11-26 DIAGNOSIS — I10 Essential (primary) hypertension: Secondary | ICD-10-CM | POA: Insufficient documentation

## 2010-11-26 DIAGNOSIS — M25549 Pain in joints of unspecified hand: Secondary | ICD-10-CM | POA: Insufficient documentation

## 2010-11-26 DIAGNOSIS — S52539A Colles' fracture of unspecified radius, initial encounter for closed fracture: Secondary | ICD-10-CM

## 2010-11-26 DIAGNOSIS — M25649 Stiffness of unspecified hand, not elsewhere classified: Secondary | ICD-10-CM | POA: Insufficient documentation

## 2010-11-26 DIAGNOSIS — M6281 Muscle weakness (generalized): Secondary | ICD-10-CM | POA: Insufficient documentation

## 2010-11-26 DIAGNOSIS — M25449 Effusion, unspecified hand: Secondary | ICD-10-CM | POA: Insufficient documentation

## 2010-11-26 NOTE — Progress Notes (Signed)
Occupational Therapy Evaluation  Patient Details  Name: Tonya Henderson MRN: [id number] Date of Birth: 02/10/1919  Today's Date: 11/26/2010 Time: 4098-1191 OT Eval 1015-1030 15' Manual Therapy 1031-1100 29' Time Calculation (min): 50 min Visit#: 1  of 16   Re-eval: 12/24/10  Assessment Diagnosis: Right Hand Stiffness and Edema S/P Right Wrist Fracture - Dr. Romeo Apple Surgical Date: 11/06/10 Next MD Visit: 12/19/10 Prior Therapy: Leta Speller  Past Medical History:  Past Medical History  Diagnosis Date  . Hypertension   . Degenerated eye   . High cholesterol   . Enlarged heart   . CHF (congestive heart failure)    Past Surgical History:  Past Surgical History  Procedure Date  . Eye surgery     [facility], [place]  . Orif wrist fracture 11/06/2010    Procedure: OPEN REDUCTION INTERNAL FIXATION (ORIF) WRIST FRACTURE;  Surgeon: Fuller Canada, MD;  Location: [facility];  Service: Orthopedics;  Laterality: Right;  With DVR Plates    Subjective Symptoms/Limitations Symptoms: S:  I fell on 11/03/10 and broke my wrist.   Limitations: History:  Ms. Chesler fell on 11/03/10, fracturing her right wrist.  She had surgery on 11/06/10.  She presents with her right arm in a removable brace and significant swelling in her right hand.  She has been referred to occupational therapy for evaluation and treatment for ROM for her MCPJs.   Pain Assessment Multiple Pain Sites: No  Precautions/Restrictions  Precautions Precautions:  (ROM to Right Hand Only)  Prior Functioning     Assessment RUE Assessment RUE Assessment:  (edema MCPJ R:  21.0 cm L:  19.0 cm thumb IPJ R:  7.1 L:  5.9) RUE AROM (degrees) Right Forearm Pronation  0-80-90: 42 Degrees Right Forearm Supination  0-80-90: 42 Degrees Right Wrist Extension 0-70: 10 Degrees Right Wrist Flexion 0-80: 14 Degrees Right Composite Finger Flexion:  (t 24/40, i 50/76/42, l 64/76/38, r 60/76/44, s 54/76/42)  Exercise/Treatments Hand Exercises MCPJ  Flexion: PROM;AROM;5 reps MCPJ Extension: PROM;AROM;5 reps PIPJ Flexion: PROM;AROM;5 reps PIPJ Extension: PROM;AROM;5 reps DIPJ Flexion: PROM;AROM;5 reps DIPJ Extension: PROM;AROM;5 reps Joint Blocking Exercises: 5 reps Digit Composite Abduction: 5 reps Thumb Opposition: 5 reps each finger  Manual Therapy Manual Therapy: Other (comment) Edema Management: edema massage, elevation, edema glove education to decrease right hand swelling Myofascial Release: MFR and manual stretching to right hand to decrease stiffness and swelling, and increase mobility. Other Manual Therapy: cleaned incision and applied hydragel  Occupational Therapy Assessment and Plan OT Assessment and Plan Clinical Impression Statement: A:  Patient presents with increased edema s/p right wrist ORIF, causing decreased AROM and strength in her right hand. Rehab Potential: Excellent Clinical Impairments Affecting Rehab Potential: HEP:  tendon glides, fmc training, active hand exercises, edema management. OT Frequency: Min 2X/week OT Duration: 8 weeks OT Treatment/Interventions: Therapeutic exercise;Manual therapy;Therapeutic activities;Patient/family education;Other (comment) (edema mgmt, modalities, splinting. ) OT Plan: P: Skilled OT intervention to decrease edema, stiffness, and pain and increase AROM, strength, FMC, and functional use of right hand.  Treatment Plan:  Edema massage, MFR, gentle joint mobs.  Ther Ex:  joint blocking, tendon glides, sponges, fmc activities.     Goals Short Term Goals Time to Complete Short Term Goals: 4 weeks Short Term Goal 1: Patient will be educated on HEP. Short Term Goal 2: Patient will decrease edema by 1.0 cm in her right hand. Short Term Goal 3: Patient will increase right digit AROM by 10 for increased ability to use right hand with daily tasks.  Short Term Goal 4: Assess grip and pinch strength and FMC. Short Term Goal 5: Decrease fascial restrictions from mod to min. Long Term  Goals Time to Complete Long Term Goals: 8 weeks Long Term Goal 1: Patient will use her right hand actively with all B/IADLs and leisure activities. Long Term Goal 2: Patient will decrease edema by 2.0 cm in her right hand. Long Term Goal 3: Patient will increase right hand AROM to [facility] for increased use with ADLs. Long Term Goal 4: Patient will increase right hand strength to [facility] for increase participation in ADLs. Long Term Goal 5: Patient will increase right hand FMC to [facility] for increased ability to use right hand actively with ADLs. End of Session Patient Active Problem List  Diagnoses  . Colles' fracture  . Pain in joint, hand  . Edema of upper extremity  . Muscle weakness (generalized)   End of Session Activity Tolerance: Patient tolerated treatment well General Behavior During Session: [facility] for tasks performed Cognition: [facility] for tasks performed  Time Calculation Start Time: 1015 Stop Time: 1105 Time Calculation (min): 50 min  Shirlean Mylar, OTR/L  11/26/2010, 12:52 PM

## 2010-11-29 ENCOUNTER — Ambulatory Visit (HOSPITAL_COMMUNITY)
Admission: RE | Admit: 2010-11-29 | Discharge: 2010-11-29 | Disposition: A | Discharge status: Home / Self Care | Payer: Medicare Other | Source: Ambulatory Visit | Attending: Family Medicine | Admitting: Family Medicine

## 2010-11-29 DIAGNOSIS — M6281 Muscle weakness (generalized): Secondary | ICD-10-CM

## 2010-11-29 DIAGNOSIS — R600 Localized edema: Secondary | ICD-10-CM

## 2010-11-29 DIAGNOSIS — M25549 Pain in joints of unspecified hand: Secondary | ICD-10-CM

## 2010-11-29 NOTE — Progress Notes (Signed)
Occupational Therapy Treatment  Patient Details  Name: Tonya Henderson MRN: [id number] Date of Birth: July 29, 1919  Today's Date: 11/29/2010 Time: 6962-9528 Time Calculation (min): 40 min Manual Therapy [phone] 23' Therapeutic Exercises [phone] 16' Visit#: 2  of 16   Re-eval: 12/24/10    Subjective Symptoms/Limitations Symptoms: S:  I have been doing my exercises. Pain Assessment Multiple Pain Sites: No  O:  Exercise/Treatments Hand Exercises MCPJ Flexion: PROM;AROM;5 reps MCPJ Extension: PROM;AROM;5 reps PIPJ Flexion: PROM;AROM;5 reps PIPJ Extension: PROM;AROM;5 reps DIPJ Flexion: PROM;AROM;5 reps DIPJ Extension: PROM;AROM;5 reps Joint Blocking Exercises: 5 reps PROM and AROM each digit each joint Digit Composite Abduction: 10 reps Thumb Opposition: 10 reps each digit, able to slide thumb to DIPJ of SF Sponges: 4, 6,7 Large Pegboard: 10 pegs in and out  Manual Therapy Manual Therapy: Edema management Edema Management: retrograde massage to decrease edema in right hand.  edema measurements at beginning of session:  MCPJ 19.8 cm after massage 19.5 cm.  IPJ of thumb 6.5 cm at beginning of session. Myofascial Release: manual stretching and PROM with gentle joint mobs to right hand to decrease edema and stiffness and increase mobility. [phone] Other Manual Therapy: cleaned incision with kari-klenz, applied petroleum jelly to incision and applied gauze.  Occupational Therapy Assessment and Plan OT Assessment and Plan Clinical Impression Statement: A:  Edema decreased to 19.5 in her right MCPJ and 6.5 cm in her thumb IPJ today. OT Plan: P:  Decrease edema to 19.0 cm in right MCPJ and continue to increase AROM and functional use of her right hand.   Goals Short Term Goals Time to Complete Short Term Goals: 4 weeks Short Term Goal 1: Patient will be educated on HEP. Short Term Goal 1 Progress: Progressing toward goal Short Term Goal 2: Patient will decrease edema by 1.0 cm in  her right hand. Short Term Goal 2 Progress: Progressing toward goal Short Term Goal 3: Patient will increase right digit AROM by 10 for increased ability to use right hand with daily tasks. Short Term Goal 3 Progress: Progressing toward goal Short Term Goal 4: Assess grip and pinch strength and FMC. Short Term Goal 4 Progress: Progressing toward goal Short Term Goal 5: Decrease fascial restrictions from mod to min. Short Term Goal 5 Progress: Progressing toward goal Long Term Goals Time to Complete Long Term Goals: 8 weeks Long Term Goal 1: Patient will use her right hand actively with all B/IADLs and leisure activities. Long Term Goal 1 Progress: Progressing toward goal Long Term Goal 2: Patient will decrease edema by 2.0 cm in her right hand. Long Term Goal 2 Progress: Progressing toward goal Long Term Goal 3: Patient will increase right hand AROM to [facility] for increased use with ADLs. Long Term Goal 3 Progress: Progressing toward goal Long Term Goal 4: Patient will increase right hand strength to [facility] for increase participation in ADLs. Long Term Goal 4 Progress: Progressing toward goal Long Term Goal 5: Patient will increase right hand [facility] to [facility] for increased ability to use right hand actively with ADLs. Long Term Goal 5 Progress: Progressing toward goal End of Session Patient Active Problem List  Diagnoses  . Colles' fracture  . Pain in joint, hand  . Edema of upper extremity  . Muscle weakness (generalized)   End of Session Activity Tolerance: Patient tolerated treatment well General Behavior During Session: [facility] for tasks performed Cognition: [facility] for tasks performed   Shirlean Mylar, OTR/L  11/29/2010, 9:29 AM

## 2010-12-04 ENCOUNTER — Ambulatory Visit (HOSPITAL_COMMUNITY)
Admission: RE | Admit: 2010-12-04 | Discharge: 2010-12-04 | Disposition: A | Discharge status: Home / Self Care | Payer: Medicare Other | Source: Ambulatory Visit | Attending: Orthopedic Surgery | Admitting: Orthopedic Surgery

## 2010-12-04 DIAGNOSIS — M25549 Pain in joints of unspecified hand: Secondary | ICD-10-CM

## 2010-12-04 DIAGNOSIS — R600 Localized edema: Secondary | ICD-10-CM

## 2010-12-04 DIAGNOSIS — M6281 Muscle weakness (generalized): Secondary | ICD-10-CM

## 2010-12-04 NOTE — Progress Notes (Signed)
Occupational Therapy Treatment  Patient Details  Name: Tonya Henderson MRN: [id number] Date of Birth: Sep 11, 1919  Today's Date: 12/04/2010 Time: 4098-1191 Time Calculation (min): 44 min Wound Care [id number] 13' no charge Manual Therapy 1308-6578 12' Therapeutic Exercise [phone] 17' Visit#: 3  of 16   Re-eval: 12/24/10    Subjective Symptoms/Limitations Symptoms: S:  No one at home stretches them as good as you do here.  But, I am doing my exercises. Pain Assessment Currently in Pain?: No/denies  O:  Exercise/Treatments Hand Exercises MCPJ Flexion: PROM;AROM;5 reps MCPJ Extension: PROM;AROM;5 reps PIPJ Flexion: PROM;AROM;5 reps PIPJ Extension: PROM;AROM;5 reps DIPJ Flexion: PROM;AROM;5 reps DIPJ Extension: PROM;AROM;5 reps Joint Blocking Exercises: 5 reps PROM and AROM each digit each joint Digit Composite Abduction: 10 reps Thumb Opposition: 10 reps each digit, able to slide thumb to DIPJ of SF Sponges: multiple attempts, 10 was the most at one time Tendon Glides: x 5 Large Pegboard: 20 pegs  Manual Therapy Edema Management: retrograde massage to decrease edema in right hand with passive ranging and gentle joint mobs to hand and digits to increase flow of fluid out of hand.  [id number] Other Manual Therapy: cleaned incision with kari-klenz, applied petroleum jelly to incision and wrapped with gauze.  Incision is healing very well, one q-tip width opening at proximal end of incision that has scant blood flow, no tunneling evident, will continue to monitor.  Occupational Therapy Assessment and Plan OT Assessment and Plan Clinical Impression Statement: A:  Incision is healing well - distal end has small opening. OT Plan: Measure circumferential edema.   Goals Short Term Goals Time to Complete Short Term Goals: 4 weeks Short Term Goal 1: Patient will be educated on HEP. Short Term Goal 1 Progress: Progressing toward goal Short Term Goal 2: Patient will decrease edema by  1.0 cm in her right hand. Short Term Goal 2 Progress: Progressing toward goal Short Term Goal 3: Patient will increase right digit AROM by 10 for increased ability to use right hand with daily tasks. Short Term Goal 3 Progress: Progressing toward goal Short Term Goal 4: Assess grip and pinch strength and FMC. Short Term Goal 4 Progress: Progressing toward goal Short Term Goal 5: Decrease fascial restrictions from mod to min. Short Term Goal 5 Progress: Progressing toward goal Long Term Goals Time to Complete Long Term Goals: 8 weeks Long Term Goal 1: Patient will use her right hand actively with all B/IADLs and leisure activities. Long Term Goal 1 Progress: Progressing toward goal Long Term Goal 2: Patient will decrease edema by 2.0 cm in her right hand. Long Term Goal 2 Progress: Progressing toward goal Long Term Goal 3: Patient will increase right hand AROM to [facility] for increased use with ADLs. Long Term Goal 3 Progress: Progressing toward goal Long Term Goal 4: Patient will increase right hand strength to [facility] for increase participation in ADLs. Long Term Goal 4 Progress: Progressing toward goal Long Term Goal 5: Patient will increase right hand [facility] to [facility] for increased ability to use right hand actively with ADLs. Long Term Goal 5 Progress: Progressing toward goal End of Session Patient Active Problem List  Diagnoses  . Colles' fracture  . Pain in joint, hand  . Edema of upper extremity  . Muscle weakness (generalized)   End of Session Activity Tolerance: Patient tolerated treatment well General Behavior During Session: [facility] for tasks performed Cognition: [facility] for tasks performed   Shirlean Mylar, OTR/L  12/04/2010, 2:32 PM

## 2010-12-05 ENCOUNTER — Ambulatory Visit (HOSPITAL_COMMUNITY)
Admission: RE | Admit: 2010-12-05 | Discharge: 2010-12-05 | Disposition: A | Discharge status: Home / Self Care | Payer: Medicare Other | Source: Ambulatory Visit | Attending: Family Medicine | Admitting: Family Medicine

## 2010-12-05 DIAGNOSIS — M25449 Effusion, unspecified hand: Secondary | ICD-10-CM | POA: Insufficient documentation

## 2010-12-05 DIAGNOSIS — M6281 Muscle weakness (generalized): Secondary | ICD-10-CM | POA: Insufficient documentation

## 2010-12-05 DIAGNOSIS — R600 Localized edema: Secondary | ICD-10-CM

## 2010-12-05 DIAGNOSIS — I10 Essential (primary) hypertension: Secondary | ICD-10-CM | POA: Insufficient documentation

## 2010-12-05 DIAGNOSIS — IMO0001 Reserved for inherently not codable concepts without codable children: Secondary | ICD-10-CM | POA: Insufficient documentation

## 2010-12-05 DIAGNOSIS — M25549 Pain in joints of unspecified hand: Secondary | ICD-10-CM | POA: Insufficient documentation

## 2010-12-05 DIAGNOSIS — M25649 Stiffness of unspecified hand, not elsewhere classified: Secondary | ICD-10-CM | POA: Insufficient documentation

## 2010-12-05 NOTE — Progress Notes (Signed)
Occupational Therapy Treatment  Patient Details  Name: Tonya Henderson MRN: [id number] Date of Birth: Mar 19, 1919  Today's Date: 12/05/2010 Time: 1020-1100 Time Calculation (min): 40 min Manual Therapy 4098-1191 14' Dressing change 1020-1031 no charge Therapeutic Exercise 1046-1100 14' Visit#: 4  of 16   Re-eval: 12/24/10    Subjective Symptoms/Limitations Symptoms: S:  It feels good to stretch my hand. Pain Assessment Currently in Pain?: No/denies Multiple Pain Sites: No  O:  Exercise/Treatments Hand Exercises MCPJ Flexion: PROM;AROM;5 reps MCPJ Extension: PROM;AROM;5 reps PIPJ Flexion: PROM;AROM;5 reps PIPJ Extension: PROM;AROM;5 reps DIPJ Flexion: PROM;AROM;5 reps DIPJ Extension: PROM;AROM;5 reps Joint Blocking Exercises: 5 reps PROM and AROM each digit each joint Digit Composite Abduction: 10 reps Thumb Opposition: 10 reps each digit, able to slide thumb to DIPJ of SF Sponges: 13, 14, 15 Tendon Glides: x 5 Large Pegboard: 10 pegs  Manual Therapy Manual Therapy: Other (comment) Edema Management: retrograde massage to decrease edema in right hand with passive ranging and gentle joint mobs to hand and digits in increase flow of fluid out of hand. MCPJ 19.8 cm and 6.5 thumb IPJ Myofascial Release: manual stretching and PROM with gentle joint mobs to right hand to decrease edema and stiffness and increase mobility. Other Manual Therapy: cleansed incision with kari-klenz, applied petroleum jelly to incision and applied hyrogel soaked 4x4 to wound.  wrapped in gauze.  No bleeding this date, healing well.  Occupational Therapy Assessment and Plan OT Assessment and Plan Clinical Impression Statement: A:  Progressing well towards goals.  Able to pick up 17 sponges today. OT Plan: P:  Decrease edema by .3 cm   Goals Short Term Goals Time to Complete Short Term Goals: 4 weeks Short Term Goal 1: Patient will be educated on HEP. Short Term Goal 2: Patient will decrease edema by  1.0 cm in her right hand. Short Term Goal 3: Patient will increase right digit AROM by 10 for increased ability to use right hand with daily tasks. Short Term Goal 4: Assess grip and pinch strength and FMC. Short Term Goal 5: Decrease fascial restrictions from mod to min. Long Term Goals Time to Complete Long Term Goals: 8 weeks Long Term Goal 1: Patient will use her right hand actively with all B/IADLs and leisure activities. Long Term Goal 2: Patient will decrease edema by 2.0 cm in her right hand. Long Term Goal 3: Patient will increase right hand AROM to [facility] for increased use with ADLs. Long Term Goal 4: Patient will increase right hand strength to [facility] for increase participation in ADLs. Long Term Goal 5: Patient will increase right hand FMC to [facility] for increased ability to use right hand actively with ADLs. End of Session Patient Active Problem List  Diagnoses  . Colles' fracture  . Pain in joint, hand  . Edema of upper extremity  . Muscle weakness (generalized)   End of Session Activity Tolerance: Patient tolerated treatment well General Behavior During Session: [facility] for tasks performed Cognition: [facility] for tasks performed   Shirlean Mylar, OTR/L  12/05/2010, 11:19 AM

## 2010-12-10 ENCOUNTER — Ambulatory Visit (HOSPITAL_COMMUNITY)
Admission: RE | Admit: 2010-12-10 | Discharge: 2010-12-10 | Disposition: A | Discharge status: Home / Self Care | Payer: Medicare Other | Source: Ambulatory Visit | Attending: Family Medicine | Admitting: Family Medicine

## 2010-12-10 DIAGNOSIS — M6281 Muscle weakness (generalized): Secondary | ICD-10-CM

## 2010-12-10 DIAGNOSIS — M25549 Pain in joints of unspecified hand: Secondary | ICD-10-CM

## 2010-12-10 DIAGNOSIS — R600 Localized edema: Secondary | ICD-10-CM

## 2010-12-10 NOTE — Progress Notes (Signed)
Occupational Therapy Treatment  Patient Details  Name: Tonya Henderson MRN: [id number] Date of Birth: [birth year]  Today's Date: 12/10/2010 Time: 4098-1191 Time Calculation (min): 71 min Manual Therapy 4782-9562 22' Wound Care [phone] no charge Therapeutic Exercise [phone] 17' Visit#: 5  of 16   Re-eval: 12/24/10    Subjective Symptoms/Limitations Symptoms: S:  My thumb is a little sore, I wonder if I am using it too much. Pain Assessment Currently in Pain?: No/denies  O:  Exercise/Treatments Hand Exercises MCPJ Flexion: PROM;AROM;5 reps MCPJ Extension: PROM;AROM;5 reps PIPJ Flexion: PROM;AROM;5 reps PIPJ Extension: PROM;AROM;5 reps DIPJ Flexion: PROM;AROM;5 reps DIPJ Extension: PROM;AROM;5 reps Joint Blocking Exercises: 5 reps PROM and AROM each digit each joint Digit Composite Abduction: 10 reps Thumb Opposition: 10 reps each digit, able to slide thumb to DIPJ of SF Sponges: 9, 10, 11, 12 Fine Motor Coordination:  (tied shoe x 2) Tendon Glides: x 5 Large Pegboard: 15 pegs  Manual Therapy Manual Therapy: Myofascial release Edema Management: retrograde massage to decrease edema in right hand wtih passive ranging and gentle joint mobs to hand and digits to increase flow of fluid out of hand. Myofascial Release: Manual stretching and PROM with gentle joint mobs to right hand to decrease edema and stiffness and increase mobility. Other Manual Therapy: cleansed incision with kari-klenz, debrided dry skin and slough.  Applied petroleum jelly and hydragel soaked 4x4 to incision.  Wrapped in gauze.  Healing beautifully with only one small qtip size opening along distal end of incision.  Occupational Therapy Assessment and Plan OT Assessment and Plan Clinical Impression Statement: A:  Incision healing nicely, able to tie shoes [place]. OT Plan: P:  Assess edema.   Goals Short Term Goals Time to Complete Short Term Goals: 4 weeks Short Term Goal 1: Patient will be educated on  HEP. Short Term Goal 1 Progress: Progressing toward goal Short Term Goal 2: Patient will decrease edema by 1.0 cm in her right hand. Short Term Goal 2 Progress: Progressing toward goal Short Term Goal 3: Patient will increase right digit AROM by 10 for increased ability to use right hand with daily tasks. Short Term Goal 3 Progress: Progressing toward goal Short Term Goal 4: Assess grip and pinch strength and FMC. Short Term Goal 4 Progress: Progressing toward goal Short Term Goal 5: Decrease fascial restrictions from mod to min. Short Term Goal 5 Progress: Progressing toward goal Long Term Goals Time to Complete Long Term Goals: 8 weeks Long Term Goal 1: Patient will use her right hand actively with all B/IADLs and leisure activities. Long Term Goal 1 Progress: Progressing toward goal Long Term Goal 2: Patient will decrease edema by 2.0 cm in her right hand. Long Term Goal 2 Progress: Progressing toward goal Long Term Goal 3: Patient will increase right hand AROM to [facility] for increased use with ADLs. Long Term Goal 3 Progress: Progressing toward goal Long Term Goal 4: Patient will increase right hand strength to [facility] for increase participation in ADLs. Long Term Goal 4 Progress: Progressing toward goal Long Term Goal 5: Patient will increase right hand [facility] to [facility] for increased ability to use right hand actively with ADLs. Long Term Goal 5 Progress: Progressing toward goal End of Session Patient Active Problem List  Diagnoses  . Colles' fracture  . Pain in joint, hand  . Edema of upper extremity  . Muscle weakness (generalized)   End of Session Activity Tolerance: Patient tolerated treatment well General Behavior During Session: [facility] for tasks performed  Cognition: [facility] for tasks performed   Shirlean Mylar, OTR/L  12/10/2010, 4:48 PM

## 2010-12-13 ENCOUNTER — Ambulatory Visit (HOSPITAL_COMMUNITY)
Admission: RE | Admit: 2010-12-13 | Discharge: 2010-12-13 | Disposition: A | Discharge status: Home / Self Care | Payer: Medicare Other | Source: Ambulatory Visit | Attending: Family Medicine | Admitting: Family Medicine

## 2010-12-13 DIAGNOSIS — M25549 Pain in joints of unspecified hand: Secondary | ICD-10-CM

## 2010-12-13 DIAGNOSIS — M6281 Muscle weakness (generalized): Secondary | ICD-10-CM

## 2010-12-13 DIAGNOSIS — R600 Localized edema: Secondary | ICD-10-CM

## 2010-12-13 NOTE — Progress Notes (Signed)
Occupational Therapy Treatment  Patient Details  Name: Tonya Henderson MRN: [id number] Date of Birth: 12-08-1919  Today's Date: 12/13/2010 Time: 4098-1191 Time Calculation (min): 45 min Manual Therapy [id number] 10' Dressing Change 1350-1405 15' n/c Therapeutic Exercises [phone] 11' Visit#: 6  of 16   Re-eval: 12/24/10    Subjective Symptoms/Limitations Symptoms: S:  I had to loosen the top strap a little bit, but otherwise I am feeling good. Pain Assessment Currently in Pain?: No/denies  O:  Exercise/Treatments Hand Exercises MCPJ Flexion: PROM;AROM;5 reps MCPJ Extension: PROM;AROM;5 reps PIPJ Flexion: PROM;AROM;5 reps PIPJ Extension: PROM;AROM;5 reps DIPJ Flexion: PROM;AROM;5 reps DIPJ Extension: PROM;AROM;5 reps Joint Blocking Exercises: 5 reps PROM and AROM each digit each joint Digit Composite Abduction: 10 reps Thumb Opposition: 10 reps each digit, able to slide thumb to DIPJ of SF Sponges: 9, 7, 10, 12, 16 Tendon Glides: x 5 Large Pegboard: 15 pegs  Manual Therapy Edema Management: retrograde massage to decrease edema in right hand with passive ranging and gentle joint mobilizations to hand and digits to increase flow of fluid in and out of hand.  edema assessed at MCPJ 20.0cm and thumb IPJ 6.7 cm Myofascial Release: Manual stretching and PROM with gentle joint mobs to right hand to decrease edema and stiffness and increase mobility. Other Manual Therapy: cleansed incision with kari-klenz, debrided dry skin.  Applied petroleum jell and hyrogel soaked 4x4 to incision.  Wrapped in gauze.  Healing well, small opening is getting smaller.  Occupational Therapy Assessment and Plan OT Assessment and Plan Clinical Impression Statement: A;  Able to go from medium to small edema glove. OT Plan: P:  Attempt blue hand gripper.   Goals Short Term Goals Time to Complete Short Term Goals: 4 weeks Short Term Goal 1: Patient will be educated on HEP. Short Term Goal 2: Patient  will decrease edema by 1.0 cm in her right hand. Short Term Goal 3: Patient will increase right digit AROM by 10 for increased ability to use right hand with daily tasks. Short Term Goal 4: Assess grip and pinch strength and FMC. Short Term Goal 5: Decrease fascial restrictions from mod to min. Long Term Goals Time to Complete Long Term Goals: 8 weeks Long Term Goal 1: Patient will use her right hand actively with all B/IADLs and leisure activities. Long Term Goal 2: Patient will decrease edema by 2.0 cm in her right hand. Long Term Goal 3: Patient will increase right hand AROM to [facility] for increased use with ADLs. Long Term Goal 4: Patient will increase right hand strength to [facility] for increase participation in ADLs. Long Term Goal 5: Patient will increase right hand FMC to [facility] for increased ability to use right hand actively with ADLs. End of Session Patient Active Problem List  Diagnoses  . Colles' fracture  . Pain in joint, hand  . Edema of upper extremity  . Muscle weakness (generalized)   End of Session Activity Tolerance: Patient tolerated treatment well General Behavior During Session: [facility] for tasks performed Cognition: [facility] for tasks performed   Shirlean Mylar, OTR/L  12/13/2010, 3:21 PM

## 2010-12-16 ENCOUNTER — Ambulatory Visit (HOSPITAL_COMMUNITY)
Admission: RE | Admit: 2010-12-16 | Discharge: 2010-12-16 | Disposition: A | Discharge status: Home / Self Care | Payer: Medicare Other | Source: Ambulatory Visit | Attending: Family Medicine | Admitting: Family Medicine

## 2010-12-16 NOTE — Progress Notes (Signed)
Occupational Therapy Treatment  Patient Details  Name: Tonya Henderson MRN: [id number] Date of Birth: August 18, 1919  Today's Date: 12/16/2010 Time: 4098-1191 Time Calculation (min): 58 min Wound care 1347-1400 no charge Manual Therapy 1400-1420 20' Therapeutic Exercises [phone] 24' Visit#: 7  of 16   Re-eval: 12/24/10    Subjective Symptoms/Limitations Symptoms: S:  Its a little stiff today. Pain Assessment Currently in Pain?: No/denies Multiple Pain Sites: No  O:  Exercise/Treatments Hand Exercises MCPJ Flexion: PROM;AROM;5 reps MCPJ Extension: PROM;AROM;5 reps PIPJ Flexion: PROM;AROM;5 reps PIPJ Extension: PROM;AROM;5 reps DIPJ Flexion: PROM;AROM;5 reps DIPJ Extension: PROM;AROM;5 reps Joint Blocking Exercises: 5 reps PROM and AROM each digit each joint Digit Composite Abduction: 10 reps Thumb Opposition: 10 reps each digit, able to slide thumb to DIPJ of SF Hand Gripper with Large Beads: large blue handgripper 1 red band x 10 reps with min pa. Sponges: 11, 13, 18, 18 Large Pegboard: 15 pegs  Manual Therapy Manual Therapy: Edema management Edema Management: retrograde massage to decrease edema in right hand with passive ranging and gentle joint mobilizaiton to hand and digits to increase flow of fluid in and out of hand. Myofascial Release: manual stretching and PROM with gentle joint mobs to hands. Other Manual Therapy: cleansed incision with kari-klenz, no debriding necessary.  Applied petroleum jelly to small spots and hydrogel soaked 4x4.  Wrapped in gauze.    Occupational Therapy Assessment and Plan OT Assessment and Plan Clinical Impression Statement: A:  Incision is healed 95%.  Began blue handgripper this date. OT Plan: P:   REASSESS   Goals Short Term Goals Time to Complete Short Term Goals: 4 weeks Short Term Goal 1: Patient will be educated on HEP. Short Term Goal 1 Progress: Progressing toward goal Short Term Goal 2: Patient will decrease edema by 1.0  cm in her right hand. Short Term Goal 2 Progress: Progressing toward goal Short Term Goal 3: Patient will increase right digit AROM by 10 for increased ability to use right hand with daily tasks. Short Term Goal 3 Progress: Progressing toward goal Short Term Goal 4: Assess grip and pinch strength and FMC. Short Term Goal 4 Progress: Progressing toward goal Short Term Goal 5: Decrease fascial restrictions from mod to min. Short Term Goal 5 Progress: Progressing toward goal Long Term Goals Time to Complete Long Term Goals: 8 weeks Long Term Goal 1: Patient will use her right hand actively with all B/IADLs and leisure activities. Long Term Goal 1 Progress: Progressing toward goal Long Term Goal 2: Patient will decrease edema by 2.0 cm in her right hand. Long Term Goal 2 Progress: Progressing toward goal Long Term Goal 3: Patient will increase right hand AROM to [facility] for increased use with ADLs. Long Term Goal 3 Progress: Progressing toward goal Long Term Goal 4: Patient will increase right hand strength to [facility] for increase participation in ADLs. Long Term Goal 4 Progress: Progressing toward goal Long Term Goal 5: Patient will increase right hand [facility] to [facility] for increased ability to use right hand actively with ADLs. Long Term Goal 5 Progress: Progressing toward goal End of Session Patient Active Problem List  Diagnoses  . Colles' fracture  . Pain in joint, hand  . Edema of upper extremity  . Muscle weakness (generalized)   End of Session Activity Tolerance: Patient tolerated treatment well General Behavior During Session: [facility] for tasks performed Cognition: [facility] for tasks performed   Shirlean Mylar, OTR/L  12/16/2010, 3:12 PM

## 2010-12-18 ENCOUNTER — Ambulatory Visit (HOSPITAL_COMMUNITY)
Admission: RE | Admit: 2010-12-18 | Discharge: 2010-12-18 | Disposition: A | Discharge status: Home / Self Care | Payer: Medicare Other | Source: Ambulatory Visit | Attending: Family Medicine | Admitting: Family Medicine

## 2010-12-18 DIAGNOSIS — M6281 Muscle weakness (generalized): Secondary | ICD-10-CM

## 2010-12-18 DIAGNOSIS — R600 Localized edema: Secondary | ICD-10-CM

## 2010-12-18 DIAGNOSIS — M25549 Pain in joints of unspecified hand: Secondary | ICD-10-CM

## 2010-12-18 NOTE — Progress Notes (Signed)
Occupational Therapy Treatment  Patient Details  Name: MACKINZIE VUNCANNON MRN: [id number] Date of Birth: 1919-05-27  Today's Date: 12/18/2010 Time: 4098-1191 Time Calculation (min): 40 min Manual Therapy 147-200 13' Reassessment 201-210 9' Therapeutic Exercises [phone] 16' Visit#: 8  of 16   Re-eval: 01/15/11    Subjective Symptoms/Limitations Symptoms: S:  I can't ben my fingers quite as well today, I am not sure why. Pain Assessment Currently in Pain?: No/denies  Exercise/Treatments Hand Exercises MCPJ Flexion: PROM;AROM;5 reps MCPJ Extension: PROM;AROM;5 reps PIPJ Flexion: PROM;AROM;5 reps PIPJ Extension: PROM;AROM;5 reps DIPJ Flexion: PROM;AROM;5 reps DIPJ Extension: PROM;AROM;5 reps Joint Blocking Exercises: 5 reps PROM and AROM each digit each joint Digit Composite Abduction: 10 reps Opposition:  (x 5 t o each digit.) Thumb Opposition: 10 reps each digit, able to slide thumb to DIPJ of SF Hand Gripper with Large Beads: large blue handgripper 1 red band x 30 reps with min pa. Sponges: 8, 9, 10, 18  Manual Therapy Edema Management: retrograde massage to decrease edema in right hand with passive ranging and gentle joint mobilization to hand and digits to increase flow of fluid in and out of hand. Myofascial Release: manual stretching and PROM with gentle joint mobs to hands. Other Manual Therapy: cleansed scar region with kari-klenz, no drainage, all sites completely healed, no dressing applied this date.  Occupational Therapy Assessment and Plan OT Assessment and Plan Clinical Impression Statement: A:  Please see progress note. OT Plan: P:  Begin wrist AROM and manual therapy to wrist as MD orders/permits.   Goals Short Term Goals Time to Complete Short Term Goals: 4 weeks Short Term Goal 1: Patient will be educated on HEP. Short Term Goal 1 Progress: Progressing toward goal Short Term Goal 2: Patient will decrease edema by 1.0 cm in her right hand. Short Term Goal  2 Progress: Met Short Term Goal 3: Patient will increase right digit AROM by 10 for increased ability to use right hand with daily tasks. Short Term Goal 3 Progress: Progressing toward goal Short Term Goal 4: Assess grip and pinch strength and FMC. Short Term Goal 4 Progress: Progressing toward goal Short Term Goal 5: Decrease fascial restrictions from mod to min. Short Term Goal 5 Progress: Progressing toward goal Long Term Goals Time to Complete Long Term Goals: 8 weeks Long Term Goal 1: Patient will use her right hand actively with all B/IADLs and leisure activities. Long Term Goal 1 Progress: Progressing toward goal Long Term Goal 2: Patient will decrease edema by 2.0 cm in her right hand. Long Term Goal 2 Progress: Progressing toward goal Long Term Goal 3: Patient will increase right hand AROM to [facility] for increased use with ADLs. Long Term Goal 3 Progress: Progressing toward goal Long Term Goal 4: Patient will increase right hand strength to [facility] for increase participation in ADLs. Long Term Goal 4 Progress: Progressing toward goal Long Term Goal 5: Patient will increase right hand [facility] to [facility] for increased ability to use right hand actively with ADLs. Long Term Goal 5 Progress: Progressing toward goal End of Session Patient Active Problem List  Diagnoses  . Colles' fracture  . Pain in joint, hand  . Edema of upper extremity  . Muscle weakness (generalized)   End of Session Activity Tolerance: Patient tolerated treatment well General Behavior During Session: [facility] for tasks performed Cognition: [facility] for tasks performed   Shirlean Mylar, OTR/L  12/18/2010, 3:14 PM

## 2010-12-19 ENCOUNTER — Encounter: Payer: Self-pay | Admitting: Orthopedic Surgery

## 2010-12-19 ENCOUNTER — Ambulatory Visit (INDEPENDENT_AMBULATORY_CARE_PROVIDER_SITE_OTHER): Payer: Medicare Other | Admitting: Orthopedic Surgery

## 2010-12-19 VITALS — BP 140/50 | Ht 62.0 in | Wt 142.0 lb

## 2010-12-19 DIAGNOSIS — S52539A Colles' fracture of unspecified radius, initial encounter for closed fracture: Secondary | ICD-10-CM

## 2010-12-19 NOTE — Progress Notes (Signed)
Postop visit.  Date of surgery October 3.  Today patient comes in for  x-ray.   X-rays separate report.  3 views RIGHT wrist for fracture followup.  A volar plate is seen transfixing a distal radius fracture, which is in good position. Hardware is unchanged.  Impression stable fixation, RIGHT wrist.  She is still stiff in the hand and there is significant swelling. There appears already placed a glove for edema. She is in a Velcro splint.  I can move the metacarpophalangeal joints, passively to 90, but it causes significant pain. She still has swelling, and I've advised her to elevate more and ice more

## 2010-12-19 NOTE — Patient Instructions (Signed)
CONTINUE OT  

## 2010-12-24 ENCOUNTER — Ambulatory Visit (HOSPITAL_COMMUNITY)
Admission: RE | Admit: 2010-12-24 | Discharge: 2010-12-24 | Disposition: A | Discharge status: Home / Self Care | Payer: Medicare Other | Source: Ambulatory Visit | Attending: Family Medicine | Admitting: Family Medicine

## 2010-12-24 DIAGNOSIS — M25549 Pain in joints of unspecified hand: Secondary | ICD-10-CM

## 2010-12-24 DIAGNOSIS — M6281 Muscle weakness (generalized): Secondary | ICD-10-CM

## 2010-12-24 DIAGNOSIS — R600 Localized edema: Secondary | ICD-10-CM

## 2010-12-24 NOTE — Progress Notes (Signed)
Occupational Therapy Treatment  Patient Details  Name: Tonya Henderson MRN: [id number] Date of Birth: [birth year]  Today's Date: 12/24/2010 Time: 7846-9629 Time Calculation (min): 46 min Manual Therapy 1600-1630 30' Therapeutic Exercises [phone] 15' Visit#: 9  of 16   Re-eval: 01/15/11    Subjective Symptoms/Limitations Symptoms: S:  The MD wants me to be able to bend my hand and fingers more.  I can hold the phone in this hand now. Limitations: Reviewed her progress with her thus far, she is making steady progress towards her goals and will begin wrist P/AROM and manual therapy this date. Pain Assessment Currently in Pain?: No/denies  Exercise/Treatments Wrist Exercises Forearm Supination: PROM;AROM;10 reps (velcro board x 5) Forearm Pronation: PROM;AROM;10 reps (velcro board x 5) Wrist Flexion: PROM;AROM;10 reps (velcro board x 5) Wrist Extension: PROM;AROM;10 reps (velcro board x 5) Wrist Radial Deviation: PROM;AROM;10 reps Wrist Ulnar Deviation: PROM;AROM;10 reps   Sponges: 10, 15, 15     Hand Exercises MCPJ Flexion: PROM;AROM;5 reps MCPJ Extension: PROM;AROM;5 reps PIPJ Flexion: PROM;AROM;5 reps PIPJ Extension: PROM;AROM;5 reps DIPJ Flexion: PROM;AROM;5 reps DIPJ Extension: PROM;AROM;5 reps Joint Blocking Exercises: 5 reps PROM and AROM each digit each joint Digit Composite Abduction: 10 reps Thumb Opposition: 10 reps each digit, able to slide thumb to DIPJ of SF Sponges: 10, 15, 15  Manual Therapy Manual Therapy: Myofascial release Edema Management: retrograde massage, elevation, active movement Myofascial Release: MFR and manual stretching to right flexor and extensor forearm, wrist, and hand with gentle stretching into supination, pronation, wrist flexion and extension and digit fleixon.  Other Manual Therapy: wound completely healed dc wound care  Occupational Therapy Assessment and Plan OT Assessment and Plan Clinical Impression Statement: A:  Began wrist  MFR, AROM, PROM and ther ex this date. OT Plan: P:  Increase PROM and AROM in right forearm, wrist, and hand to [facility] for ADLs.   Goals Short Term Goals Time to Complete Short Term Goals: 4 weeks Short Term Goal 1: Patient will be educated on HEP. Short Term Goal 2: Patient will decrease edema by 1.0 cm in her right hand. Short Term Goal 3: Patient will increase right digit AROM by 10 for increased ability to use right hand with daily tasks. Short Term Goal 4: Assess grip and pinch strength and FMC. Short Term Goal 5: Decrease fascial restrictions from mod to min. Long Term Goals Time to Complete Long Term Goals: 8 weeks Long Term Goal 1: Patient will use her right hand actively with all B/IADLs and leisure activities. Long Term Goal 2: Patient will decrease edema by 2.0 cm in her right hand. Long Term Goal 3: Patient will increase right hand AROM to [facility] for increased use with ADLs. Long Term Goal 4: Patient will increase right hand strength to [facility] for increase participation in ADLs. Long Term Goal 5: Patient will increase right hand [facility] to [facility] for increased ability to use right hand actively with ADLs. Additional Long Term Goals?: Yes Long Term Goal 6: Increase right forearm and wrist AROM to [facility] for increased ability to use right arm with daily activities. Long Term Goal 7: Incrase right forearm and wrist strength to [facility] for increased ability to lift household items. End of Session Patient Active Problem List  Diagnoses  . Colles' fracture  . Pain in joint, hand  . Edema of upper extremity  . Muscle weakness (generalized)   End of Session Activity Tolerance: Patient tolerated treatment well General Behavior During Session: [facility] for tasks performed Cognition:  [facility] for tasks performed   Shirlean Mylar, OTR/L  12/24/2010, 4:53 PM

## 2010-12-25 ENCOUNTER — Ambulatory Visit (HOSPITAL_COMMUNITY)
Admission: RE | Admit: 2010-12-25 | Discharge: 2010-12-25 | Disposition: A | Discharge status: Home / Self Care | Payer: Medicare Other | Source: Ambulatory Visit | Attending: Family Medicine | Admitting: Family Medicine

## 2010-12-25 DIAGNOSIS — M6281 Muscle weakness (generalized): Secondary | ICD-10-CM

## 2010-12-25 DIAGNOSIS — M25549 Pain in joints of unspecified hand: Secondary | ICD-10-CM

## 2010-12-25 DIAGNOSIS — R600 Localized edema: Secondary | ICD-10-CM

## 2010-12-25 NOTE — Progress Notes (Signed)
Occupational Therapy Treatment  Patient Details  Name: Tonya Henderson MRN: [id number] Date of Birth: 02-Apr-1919  Today's Date: 12/25/2010 Time: 1020-1104 Time Calculation (min): 44 min Manual Therapy 1020-1040 20' Therapeutic Exercises [phone] 23' Visit#: 10  of 16   Re-eval: 01/15/11    Subjective Symptoms/Limitations Symptoms: S:  Im doing ok. Pain Assessment Currently in Pain?: No/denies   Exercise/Treatments Wrist Exercises Forearm Supination: PROM;AROM;10 reps (velcro board x 10) Forearm Pronation: PROM;AROM;10 reps (velcro board x 10) Wrist Flexion: PROM;AROM;10 reps (velcro board x 10) Wrist Extension: PROM;AROM;10 reps (velcro board x 10) Wrist Radial Deviation: PROM;AROM;10 reps Wrist Ulnar Deviation: PROM;AROM;10 reps   Hand Exercises MCPJ Flexion: PROM;AROM;5 reps MCPJ Extension: PROM;AROM;5 reps PIPJ Flexion: PROM;AROM;5 reps PIPJ Extension: PROM;AROM;5 reps DIPJ Flexion: PROM;AROM;5 reps DIPJ Extension: PROM;AROM;5 reps Joint Blocking Exercises: 5 reps PROM and AROM each digit each joint Digit Composite Abduction: 10 reps Thumb Opposition: 10 reps each digit, able to slide thumb to DIPJ of SF Sponges: 12, 15, 15  Manual Therapy Manual Therapy: Myofascial release Edema Management: retrograde massage, elevation, active movement Myofascial Release: MFR and manual stretching to right flexor and extensor forearm, wrist, and hand with gentle stretching into supination, pronation, wrist flexion and extension and digit flexion.  1020-1040  Occupational Therapy Assessment and Plan OT Assessment and Plan Clinical Impression Statement: A:  Increased reps with velcro board. OT Plan: P:  Add yellow tputty.   Goals Short Term Goals Time to Complete Short Term Goals: 4 weeks Short Term Goal 1: Patient will be educated on HEP. Short Term Goal 2: Patient will decrease edema by 1.0 cm in her right hand. Short Term Goal 3: Patient will increase right digit AROM by  10 for increased ability to use right hand with daily tasks. Short Term Goal 4: Assess grip and pinch strength and FMC. Short Term Goal 5: Decrease fascial restrictions from mod to min. Long Term Goals Time to Complete Long Term Goals: 8 weeks Long Term Goal 1: Patient will use her right hand actively with all B/IADLs and leisure activities. Long Term Goal 2: Patient will decrease edema by 2.0 cm in her right hand. Long Term Goal 3: Patient will increase right hand AROM to [facility] for increased use with ADLs. Long Term Goal 4: Patient will increase right hand strength to [facility] for increase participation in ADLs. Long Term Goal 5: Patient will increase right hand [facility] to [facility] for increased ability to use right hand actively with ADLs. Additional Long Term Goals?: Yes Long Term Goal 6: Increase right forearm and wrist AROM to [facility] for increased ability to use right arm with daily activities. Long Term Goal 7: Incrase right forearm and wrist strength to [facility] for increased ability to lift household items. End of Session Patient Active Problem List  Diagnoses  . Colles' fracture  . Pain in joint, hand  . Edema of upper extremity  . Muscle weakness (generalized)   End of Session Activity Tolerance: Patient tolerated treatment well General Behavior During Session: [facility] for tasks performed Cognition: [facility] for tasks performed   Shirlean Mylar, OTR/L  12/25/2010, 12:07 PM

## 2010-12-31 ENCOUNTER — Ambulatory Visit (HOSPITAL_COMMUNITY)
Admission: RE | Admit: 2010-12-31 | Discharge: 2010-12-31 | Disposition: A | Discharge status: Home / Self Care | Payer: Medicare Other | Source: Ambulatory Visit | Attending: Family Medicine | Admitting: Family Medicine

## 2010-12-31 DIAGNOSIS — M25549 Pain in joints of unspecified hand: Secondary | ICD-10-CM

## 2010-12-31 DIAGNOSIS — R600 Localized edema: Secondary | ICD-10-CM

## 2010-12-31 DIAGNOSIS — M6281 Muscle weakness (generalized): Secondary | ICD-10-CM

## 2010-12-31 NOTE — Progress Notes (Signed)
Occupational Therapy Treatment  Patient Details  Name: IVADELL GAUL MRN: [id number] Date of Birth: 1920/01/22  Today's Date: 12/31/2010 Time: 7829-5621 Time Calculation (min): 52 min Manual Therapy [phone] 17' Therapeutic Exercises [phone] 34' Visit#: 11  of 16   Re-eval: 01/15/11    Subjective Symptoms/Limitations Symptoms: S:  Ive been taking off the brace at home.  I just wear it when I go out. Pain Assessment Currently in Pain?: No/denies  O:  Exercise/Treatments Wrist Exercises Forearm Supination: PROM;AROM;10 reps (velcro board x 3) Forearm Pronation: PROM;AROM;10 reps (velcro board x 3) Wrist Flexion: PROM;AROM;10 reps (velcro board x 3) Wrist Extension: PROM;AROM;10 reps (velcro board x 3) Wrist Radial Deviation: PROM;AROM;10 reps Wrist Ulnar Deviation: PROM;AROM;10 reps   Sponges: 15, 12, 13 Theraputty: Flatten;Roll;Grip;Locate Pegs Theraputty - Flatten: yellow Theraputty - Roll: yellow Theraputty - Grip: yellow Theraputty - Locate Pegs: 10 pegs Hand Gripper with Large Beads: time, resume next visit  Wrist Weighted Stretch    Fine Motor Coordination    Hand Exercises MCPJ Flexion: PROM;AROM;5 reps MCPJ Extension: PROM;AROM;5 reps PIPJ Flexion: PROM;AROM;5 reps PIPJ Extension: PROM;AROM;5 reps DIPJ Flexion: PROM;AROM;5 reps DIPJ Extension: PROM;AROM;5 reps Joint Blocking Exercises: 5 reps PROM and AROM each digit each joint Digit Composite Abduction: 10 reps Theraputty: Flatten;Roll;Grip;Locate Pegs Theraputty - Flatten: yellow Theraputty - Roll: yellow Theraputty - Grip: yellow Theraputty - Locate Pegs: 10 pegs Thumb Opposition: 10 reps each digit, able to slide thumb to DIPJ of SF Hand Gripper with Large Beads: time, resume next visit Sponges: 15, 12, 13  Manual Therapy Manual Therapy: Myofascial release Edema Management: continue to review retrograde massage, elevation, active hand movement, and icing to decrease edema. Myofascial Release:  MFR and manual stretching to right flexor and extensor forearm, wrist, and hand with gentle stretching into supination, pronation, wrist flexion, extension, and gross grasp and relese.  [phone]  Occupational Therapy Assessment and Plan OT Assessment and Plan Clinical Impression Statement: A;  Added yellow tputty. Rehab Potential: Good OT Plan: P:  Add weighted stretch with 1 # for wrist flexion, extension, supination, pronation.   Goals Short Term Goals Time to Complete Short Term Goals: 4 weeks Short Term Goal 1: Patient will be educated on HEP. Short Term Goal 1 Progress: Progressing toward goal Short Term Goal 2: Patient will decrease edema by 1.0 cm in her right hand. Short Term Goal 2 Progress: Progressing toward goal Short Term Goal 3: Patient will increase right digit AROM by 10 for increased ability to use right hand with daily tasks. Short Term Goal 3 Progress: Progressing toward goal Short Term Goal 4: Assess grip and pinch strength and FMC. Short Term Goal 4 Progress: Progressing toward goal Short Term Goal 5: Decrease fascial restrictions from mod to min. Short Term Goal 5 Progress: Progressing toward goal Long Term Goals Time to Complete Long Term Goals: 8 weeks Long Term Goal 1: Patient will use her right hand actively with all B/IADLs and leisure activities. Long Term Goal 1 Progress: Progressing toward goal Long Term Goal 2: Patient will decrease edema by 2.0 cm in her right hand. Long Term Goal 2 Progress: Progressing toward goal Long Term Goal 3: Patient will increase right hand AROM to [facility] for increased use with ADLs. Long Term Goal 3 Progress: Progressing toward goal Long Term Goal 4: Patient will increase right hand strength to [facility] for increase participation in ADLs. Long Term Goal 4 Progress: Progressing toward goal Long Term Goal 5: Patient will increase right hand [facility] to [facility]  for increased ability to use right hand actively with ADLs. Long Term Goal 5 Progress:  Progressing toward goal Additional Long Term Goals?: Yes Long Term Goal 6: Increase right forearm and wrist AROM to [facility] for increased ability to use right arm with daily activities. Long Term Goal 6 Progress: Progressing toward goal Long Term Goal 7: Incrase right forearm and wrist strength to [facility] for increased ability to lift household items. Long Term Goal 7 Progress: Progressing toward goal End of Session Patient Active Problem List  Diagnoses  . Colles' fracture  . Pain in joint, hand  . Edema of upper extremity  . Muscle weakness (generalized)   End of Session Activity Tolerance: Patient tolerated treatment well General Behavior During Session: [facility] for tasks performed Cognition: [facility] for tasks performed   Shirlean Mylar, OTR/L  12/31/2010, 10:20 AM

## 2011-01-01 ENCOUNTER — Ambulatory Visit (HOSPITAL_COMMUNITY)
Admission: RE | Admit: 2011-01-01 | Discharge: 2011-01-01 | Disposition: A | Discharge status: Home / Self Care | Payer: Medicare Other | Source: Ambulatory Visit | Attending: Family Medicine | Admitting: Family Medicine

## 2011-01-01 DIAGNOSIS — M25549 Pain in joints of unspecified hand: Secondary | ICD-10-CM

## 2011-01-01 DIAGNOSIS — M6281 Muscle weakness (generalized): Secondary | ICD-10-CM

## 2011-01-01 DIAGNOSIS — R600 Localized edema: Secondary | ICD-10-CM

## 2011-01-01 NOTE — Progress Notes (Signed)
Occupational Therapy Treatment  Patient Details  Name: Tonya Henderson MRN: [id number] Date of Birth: 05/20/1919  Today's Date: 01/01/2011 Time: 7829-5621 Time Calculation (min): 60 min Manual Therapy [phone] 30' Therapeutic Exercises [phone] 29' Visit#: 12  of 16   Re-eval: 01/15/11    Subjective Symptoms/Limitations Symptoms: S:  I got my own ice out of the fridge today for my wrist.  Pain Assessment Currently in Pain?: No/denies   Exercise/Treatments Wrist Exercises Forearm Supination: PROM;10 reps;AROM;15 reps Forearm Pronation: PROM;10 reps;AROM;15 reps Wrist Flexion: PROM;10 reps;AROM;15 reps Wrist Extension: PROM;10 reps;AROM;15 reps Wrist Radial Deviation: PROM;10 reps;AROM;15 reps Wrist Ulnar Deviation: PROM;10 reps;AROM;15 reps   Sponges: 14, 13, 13 Theraputty: Flatten;Roll;Grip;Locate Pegs Theraputty - Flatten: yellow Theraputty - Roll: yellow Theraputty - Grip: yellow Theraputty - Locate Pegs: 10 pegs Hand Gripper with Large Beads: 25  Wrist Weighted Stretch Supination - Weighted Stretch: 60 seconds (1 pound) Wrist Flexion - Weighted Stretch: 60 seconds (1 pound) Wrist Extension - Weighted Stretch: 60 seconds (1 pound)  Fine Motor Coordination Supination - Weighted Stretch: 60 seconds (1 pound) Wrist Flexion - Weighted Stretch: 60 seconds (1 pound) Wrist Extension - Weighted Stretch: 60 seconds (1 pound)  Manual Therapy Myofascial Release: MFR and manual stretching to right flexor and extensor forearm, wrist, and hand with getnle stretching into supination, pronation, wrist flexion, extension, and gross grasp and release.  [id number]  Occupational Therapy Assessment and Plan OT Assessment and Plan Clinical Impression Statement: A:  Added weighted stretch with 1# for supination, wrist flexion, wrist extension OT Plan: P:  Increase to 90 seconds with weighted stretches.   Goals Short Term Goals Time to Complete Short Term Goals: 4 weeks Short Term  Goal 1: Patient will be educated on HEP. Short Term Goal 2: Patient will decrease edema by 1.0 cm in her right hand. Short Term Goal 3: Patient will increase right digit AROM by 10 for increased ability to use right hand with daily tasks. Short Term Goal 4: Assess grip and pinch strength and FMC. Short Term Goal 5: Decrease fascial restrictions from mod to min. Long Term Goals Time to Complete Long Term Goals: 8 weeks Long Term Goal 1: Patient will use her right hand actively with all B/IADLs and leisure activities. Long Term Goal 2: Patient will decrease edema by 2.0 cm in her right hand. Long Term Goal 3: Patient will increase right hand AROM to [facility] for increased use with ADLs. Long Term Goal 4: Patient will increase right hand strength to [facility] for increase participation in ADLs. Long Term Goal 5: Patient will increase right hand FMC to [facility] for increased ability to use right hand actively with ADLs. Additional Long Term Goals?: Yes Long Term Goal 6: Increase right forearm and wrist AROM to [facility] for increased ability to use right arm with daily activities. Long Term Goal 7: Incrase right forearm and wrist strength to [facility] for increased ability to lift household items. End of Session Patient Active Problem List  Diagnoses  . Colles' fracture  . Pain in joint, hand  . Edema of upper extremity  . Muscle weakness (generalized)   End of Session Activity Tolerance: Patient tolerated treatment well General Behavior During Session: [facility] for tasks performed Cognition: [facility] for tasks performed   Shirlean Mylar, OTR/L  01/01/2011, 2:52 PM

## 2011-01-06 ENCOUNTER — Ambulatory Visit (HOSPITAL_COMMUNITY): Payer: Medicare Other | Admitting: Occupational Therapy

## 2011-01-07 ENCOUNTER — Ambulatory Visit (HOSPITAL_COMMUNITY)
Admission: RE | Admit: 2011-01-07 | Discharge: 2011-01-07 | Disposition: A | Discharge status: Home / Self Care | Payer: Medicare Other | Source: Ambulatory Visit | Attending: Family Medicine | Admitting: Family Medicine

## 2011-01-07 DIAGNOSIS — M25649 Stiffness of unspecified hand, not elsewhere classified: Secondary | ICD-10-CM | POA: Insufficient documentation

## 2011-01-07 DIAGNOSIS — R600 Localized edema: Secondary | ICD-10-CM

## 2011-01-07 DIAGNOSIS — M25449 Effusion, unspecified hand: Secondary | ICD-10-CM | POA: Insufficient documentation

## 2011-01-07 DIAGNOSIS — I10 Essential (primary) hypertension: Secondary | ICD-10-CM | POA: Insufficient documentation

## 2011-01-07 DIAGNOSIS — IMO0001 Reserved for inherently not codable concepts without codable children: Secondary | ICD-10-CM | POA: Insufficient documentation

## 2011-01-07 DIAGNOSIS — M6281 Muscle weakness (generalized): Secondary | ICD-10-CM | POA: Insufficient documentation

## 2011-01-07 DIAGNOSIS — M25549 Pain in joints of unspecified hand: Secondary | ICD-10-CM | POA: Insufficient documentation

## 2011-01-07 NOTE — Progress Notes (Signed)
Occupational Therapy Treatment  Patient Details  Name: Tonya Henderson MRN: [id number] Date of Birth: [birth year]  Today's Date: 01/07/2011 Time: 7829-5621 Time Calculation (min): 56 min Manual Therapy [phone] 19' Therapeutic Exercises [phone] 36' Visit#: 13  of 16   Re-eval: 01/15/11    Subjective Symptoms/Limitations Symptoms: S: Im using this hand to put my socks and shoes on. Pain Assessment Currently in Pain?: No/denies  Precautions/Restrictions     Mobility       O:  Exercise/Treatments Wrist Exercises Forearm Supination: PROM;Strengthening;10 reps Bar Weights/Barbell (Forearm Supination): 1 lb Forearm Pronation: PROM;Strengthening;10 reps Bar Weights/Barbell (Forearm Pronation): 1 lb Wrist Flexion: PROM;Strengthening;10 reps Bar Weights/Barbell (Wrist Flexion): 1 lb Wrist Extension: PROM;Strengthening;10 reps Bar Weights/Barbell (Wrist Extension): 1 lb Wrist Radial Deviation: PROM;Strengthening;10 reps (1#) Wrist Ulnar Deviation: PROM;Strengthening;10 reps (1#)   Sponges: 10, 10, 13 added orange and blue sponges in addition to yellow Theraputty: Flatten;Roll;Grip;Locate Pegs Theraputty - Flatten: yellow Theraputty - Roll: yellow Theraputty - Grip: yellow Theraputty - Locate Pegs: 15 pegs Hand Gripper with Large Beads: blue hand gripper with 1 red band x 30  Wrist Weighted Stretch Supination - Weighted Stretch:  (90 seconds with 1#) Wrist Flexion - Weighted Stretch:  (90 seconds with 1#) Wrist Extension - Weighted Stretch:  (90 seconds with 1#)  Fine Motor Coordination Supination - Weighted Stretch:  (90 seconds with 1#) Wrist Flexion - Weighted Stretch:  (90 seconds with 1#) Wrist Extension - Weighted Stretch:  (90 seconds with 1#)  Manual Therapy Manual Therapy: Myofascial release Myofascial Release: MFR and manual stretching to right flexor and extensor forearm, wrist, and hand with gentle stretching into supination, pronation, wrist flexion,  extension, and gross grasp and release.  [id number]  Occupational Therapy Assessment and Plan OT Assessment and Plan Clinical Impression Statement: A:  Added 1# strengthening to all wrist exercises. OT Plan: P:  Increase amount of sponges and increase to pink tputty.   Goals Short Term Goals Time to Complete Short Term Goals: 4 weeks Short Term Goal 1: Patient will be educated on HEP. Short Term Goal 1 Progress: Progressing toward goal Short Term Goal 2: Patient will decrease edema by 1.0 cm in her right hand. Short Term Goal 2 Progress: Progressing toward goal Short Term Goal 3: Patient will increase right digit AROM by 10 for increased ability to use right hand with daily tasks. Short Term Goal 3 Progress: Progressing toward goal Short Term Goal 4: Assess grip and pinch strength and FMC. Short Term Goal 4 Progress: Progressing toward goal Short Term Goal 5: Decrease fascial restrictions from mod to min. Short Term Goal 5 Progress: Progressing toward goal Long Term Goals Time to Complete Long Term Goals: 8 weeks Long Term Goal 1: Patient will use her right hand actively with all B/IADLs and leisure activities. Long Term Goal 1 Progress: Progressing toward goal Long Term Goal 2: Patient will decrease edema by 2.0 cm in her right hand. Long Term Goal 2 Progress: Progressing toward goal Long Term Goal 3: Patient will increase right hand AROM to [facility] for increased use with ADLs. Long Term Goal 3 Progress: Progressing toward goal Long Term Goal 4: Patient will increase right hand strength to [facility] for increase participation in ADLs. Long Term Goal 4 Progress: Progressing toward goal Long Term Goal 5: Patient will increase right hand [facility] to [facility] for increased ability to use right hand actively with ADLs. Long Term Goal 5 Progress: Progressing toward goal Additional Long Term Goals?: Yes Long Term Goal 6:  Increase right forearm and wrist AROM to [facility] for increased ability to use right arm with  daily activities. Long Term Goal 6 Progress: Progressing toward goal Long Term Goal 7: Incrase right forearm and wrist strength to [facility] for increased ability to lift household items. Long Term Goal 7 Progress: Progressing toward goal End of Session Patient Active Problem List  Diagnoses  . Colles' fracture  . Pain in joint, hand  . Edema of upper extremity  . Muscle weakness (generalized)   End of Session Activity Tolerance: Patient tolerated treatment well General Behavior During Session: [facility] for tasks performed Cognition: [facility] for tasks performed   Shirlean Mylar, OTR/L  01/07/2011, 11:19 AM

## 2011-01-10 ENCOUNTER — Ambulatory Visit (HOSPITAL_COMMUNITY)
Admission: RE | Admit: 2011-01-10 | Discharge: 2011-01-10 | Disposition: A | Discharge status: Home / Self Care | Payer: Medicare Other | Source: Ambulatory Visit | Attending: Family Medicine | Admitting: Family Medicine

## 2011-01-10 DIAGNOSIS — M25549 Pain in joints of unspecified hand: Secondary | ICD-10-CM

## 2011-01-10 DIAGNOSIS — R600 Localized edema: Secondary | ICD-10-CM

## 2011-01-10 DIAGNOSIS — M6281 Muscle weakness (generalized): Secondary | ICD-10-CM

## 2011-01-10 NOTE — Progress Notes (Signed)
Occupational Therapy Treatment  Patient Details  Name: ANGELLY SPEARING MRN: [id number] Date of Birth: 20-Nov-1919  Today's Date: 01/10/2011 Time: 1478-2956 Time Calculation (min): 46 min Manual Therapy [phone] 23' Therapeutic [place]ericses [phone] 22' Visit#: 14  of 16   Re-eval: 01/15/11    Subjective Symptoms/Limitations Symptoms: S:  I folded all of the towels they washed the other day. Pain Assessment Currently in Pain?: No/denies Multiple Pain Sites: No   Exercise/Treatments Wrist Exercises Forearm Supination: PROM;10 reps Forearm Pronation: PROM;10 reps Wrist Flexion: PROM;10 reps Wrist Extension: PROM;10 reps Wrist Radial Deviation: PROM;10 reps Wrist Ulnar Deviation: PROM;10 reps   Theraputty: Flatten;Roll;Grip;Locate Pegs Theraputty - Flatten: pink Theraputty - Roll: pink Theraputty - Grip: pink  Manual Therapy Manual Therapy: Myofascial release Myofascial Release: MFR and manual stretching to right flexor and extensor forearm, wrist, and hand with gentle stretching into supination, pronation, wrist flexion, extension, and gross grasp and release.  [phone] ADL for increased use of RUE:  Assigned patient the task of getting a washcloth wet and soapy, wringing it out, and wiping down the counters in the ADL kitchen.  Patient was able to complete this task using her right hand and was also able to pick up the bottle of dish detergent and put it away without difficulty.  Occupational Therapy Assessment and Plan OT Assessment and Plan Clinical Impression Statement: A:  Omitted some exercises and completed functional activity (cleaning countertop and stove) instead. OT Plan: P:  Resume omitted exercises.   Goals Short Term Goals Time to Complete Short Term Goals: 4 weeks Short Term Goal 1: Patient will be educated on HEP. Short Term Goal 2: Patient will decrease edema by 1.0 cm in her right hand. Short Term Goal 3: Patient will increase right digit AROM by 10 for  increased ability to use right hand with daily tasks. Short Term Goal 4: Assess grip and pinch strength and FMC. Short Term Goal 5: Decrease fascial restrictions from mod to min. Long Term Goals Time to Complete Long Term Goals: 8 weeks Long Term Goal 1: Patient will use her right hand actively with all B/IADLs and leisure activities. Long Term Goal 2: Patient will decrease edema by 2.0 cm in her right hand. Long Term Goal 3: Patient will increase right hand AROM to [facility] for increased use with ADLs. Long Term Goal 4: Patient will increase right hand strength to [facility] for increase participation in ADLs. Long Term Goal 5: Patient will increase right hand FMC to [facility] for increased ability to use right hand actively with ADLs. Additional Long Term Goals?: Yes Long Term Goal 6: Increase right forearm and wrist AROM to [facility] for increased ability to use right arm with daily activities. Long Term Goal 7: Incrase right forearm and wrist strength to [facility] for increased ability to lift household items. End of Session Patient Active Problem List  Diagnoses  . Colles' fracture  . Pain in joint, hand  . Edema of upper extremity  . Muscle weakness (generalized)   End of Session Activity Tolerance: Patient tolerated treatment well General Behavior During Session: [facility] for tasks performed Cognition: [facility] for tasks performed   Shirlean Mylar, OTR/L  01/10/2011, 11:18 AM

## 2011-01-13 ENCOUNTER — Ambulatory Visit (HOSPITAL_COMMUNITY)
Admission: RE | Admit: 2011-01-13 | Discharge: 2011-01-13 | Disposition: A | Discharge status: Home / Self Care | Payer: Medicare Other | Source: Ambulatory Visit | Attending: Family Medicine | Admitting: Family Medicine

## 2011-01-13 DIAGNOSIS — M25549 Pain in joints of unspecified hand: Secondary | ICD-10-CM

## 2011-01-13 DIAGNOSIS — M6281 Muscle weakness (generalized): Secondary | ICD-10-CM

## 2011-01-13 DIAGNOSIS — R600 Localized edema: Secondary | ICD-10-CM

## 2011-01-13 NOTE — Progress Notes (Signed)
Occupational Therapy Treatment  Patient Details  Name: Tonya Henderson MRN: [id number] Date of Birth: Feb 15, 1919  Today's Date: 01/13/2011 Time: 4098-1191 Time Calculation (min): 47 min Manual Therapy [phone] 20' Therapeutic Exercises [phone] 26' Visit#: 15  of 16   Re-eval: 01/15/11    Subjective Symptoms/Limitations Symptoms: S:  Ive been working my hand and wrist.  The middle one is so painful. Pain Assessment Currently in Pain?: Yes Pain Score:   3 Pain Location: Hand Pain Orientation: Right Pain Type: Acute pain  Exercise/Treatments Wrist Exercises Forearm Supination: PROM;Strengthening;10 reps Bar Weights/Barbell (Forearm Supination): 2 lbs Forearm Pronation: PROM;Strengthening;10 reps Bar Weights/Barbell (Forearm Pronation): 2 lbs Wrist Flexion: PROM;Strengthening;10 reps Bar Weights/Barbell (Wrist Flexion): 2 lbs Wrist Extension: PROM;Strengthening;10 reps Bar Weights/Barbell (Wrist Extension): 2 lbs Wrist Radial Deviation: PROM;Strengthening;10 reps Wrist Ulnar Deviation: PROM;Strengthening;15 reps   Sponges: 9, 10, 12 using orange and blue Theraputty: Flatten;Roll;Grip;Locate Pegs Theraputty - Flatten: pink Theraputty - Roll: pink Theraputty - Grip: pink Theraputty - Locate Pegs: 15 pegs Hand Gripper with Large Beads: blue hand gripper with 1 red band x 30  Wrist Weighted Stretch Supination - Weighted Stretch:  (2' with 2#) Wrist Flexion - Weighted Stretch:  (2' with 2#) Wrist Extension - Weighted Stretch:  (2' with 2#)  Manual Therapy Manual Therapy: Myofascial release Myofascial Release: MFR and manual stretching to right flexor and extensor forearm, wrist, and hand with gentle stretching into supination, pronation, wrist flexion, extension, and gross grasp and release.  [phone]  Occupational Therapy Assessment and Plan OT Assessment and Plan Clinical Impression Statement: A:  Increased to 2# with strengthening and stretching exercises. OT Plan:  P:  REASSESS   Goals Short Term Goals Time to Complete Short Term Goals: 4 weeks Short Term Goal 1: Patient will be educated on HEP. Short Term Goal 2: Patient will decrease edema by 1.0 cm in her right hand. Short Term Goal 3: Patient will increase right digit AROM by 10 for increased ability to use right hand with daily tasks. Short Term Goal 4: Assess grip and pinch strength and FMC. Short Term Goal 5: Decrease fascial restrictions from mod to min. Long Term Goals Time to Complete Long Term Goals: 8 weeks Long Term Goal 1: Patient will use her right hand actively with all B/IADLs and leisure activities. Long Term Goal 2: Patient will decrease edema by 2.0 cm in her right hand. Long Term Goal 3: Patient will increase right hand AROM to [facility] for increased use with ADLs. Long Term Goal 4: Patient will increase right hand strength to [facility] for increase participation in ADLs. Long Term Goal 5: Patient will increase right hand FMC to [facility] for increased ability to use right hand actively with ADLs. Additional Long Term Goals?: Yes Long Term Goal 6: Increase right forearm and wrist AROM to [facility] for increased ability to use right arm with daily activities. Long Term Goal 7: Incrase right forearm and wrist strength to [facility] for increased ability to lift household items. End of Session Patient Active Problem List  Diagnoses  . Colles' fracture  . Pain in joint, hand  . Edema of upper extremity  . Muscle weakness (generalized)   End of Session Activity Tolerance: Patient tolerated treatment well General Behavior During Session: [facility] for tasks performed Cognition: [facility] for tasks performed   Shirlean Mylar, OTR/L  01/13/2011, 5:04 PM

## 2011-01-15 ENCOUNTER — Ambulatory Visit (HOSPITAL_COMMUNITY)
Admission: RE | Admit: 2011-01-15 | Discharge: 2011-01-15 | Disposition: A | Discharge status: Home / Self Care | Payer: Medicare Other | Source: Ambulatory Visit | Attending: Family Medicine | Admitting: Family Medicine

## 2011-01-15 DIAGNOSIS — R600 Localized edema: Secondary | ICD-10-CM

## 2011-01-15 DIAGNOSIS — M25549 Pain in joints of unspecified hand: Secondary | ICD-10-CM

## 2011-01-15 DIAGNOSIS — M6281 Muscle weakness (generalized): Secondary | ICD-10-CM

## 2011-01-15 NOTE — Progress Notes (Signed)
Occupational Therapy Treatment  Patient Details  Name: Tonya Henderson MRN: [id number] Date of Birth: [birth year]  Today's Date: 01/15/2011 Time: 5621-3086 Time Calculation (min): 51 min Manual Therapy [phone] 18' Reassessment 1130-1141 11'  Therapeutic Exercises 9528-4132 20' Visit#: 16  of 24   Re-eval: 02/12/11    Subjective Symptoms/Limitations Symptoms: S:  Ive been working hard on moving these fingers. Pain Assessment Pain Score:   3 Pain Location: Hand Pain Orientation: Right Pain Type: Acute pain   Exercise/Treatments Hand Exercises MCPJ Flexion: PROM;AROM;5 reps MCPJ Extension: PROM;AROM;5 reps PIPJ Flexion: PROM;AROM;5 reps PIPJ Extension: PROM;AROM;5 reps DIPJ Flexion: PROM;AROM;5 reps DIPJ Extension: PROM;AROM;5 reps Theraputty: Flatten;Roll;Grip;Locate Pegs Theraputty - Flatten: pink Theraputty - Roll: pink Theraputty - Grip: pink Theraputty - Locate Pegs: 15 pegs Hand Gripper with Large Beads: blue hand gripper wtih 1 red band x 35 Sponges: 11, 11, 13 using blue and orange  Manual Therapy Manual Therapy: Myofascial release Myofascial Release: MFR and manual stretching to right flexor and extensor forearm, wrist, and hand with gentle stretching into supination, pronation, wrist flexion, extension, and gross grasp and release.  [id number]  Occupational Therapy Assessment and Plan OT Assessment and Plan Clinical Impression Statement: A:  Please see monthly progress note. OT Frequency: Min 2X/week OT Duration: 4 weeks OT Plan: P:  Increase to 15 sponges and add a rubber band to hand gripper.   Goals Short Term Goals Time to Complete Short Term Goals: 4 weeks Short Term Goal 1: Patient will be educated on HEP. Short Term Goal 1 Progress: Met Short Term Goal 2: Patient will decrease edema by 1.0 cm in her right hand. Short Term Goal 2 Progress: Met Short Term Goal 3: Patient will increase right digit AROM by 10 for increased ability to use right hand  with daily tasks. Short Term Goal 3 Progress: Met Short Term Goal 4: Assess grip and pinch strength and FMC. Short Term Goal 4 Progress: Met Short Term Goal 5: Decrease fascial restrictions from mod to min. Short Term Goal 5 Progress: Met Long Term Goals Time to Complete Long Term Goals: 8 weeks Long Term Goal 1: Patient will use her right hand actively with all B/IADLs and leisure activities. Long Term Goal 1 Progress: Progressing toward goal Long Term Goal 2: Patient will decrease edema by 2.0 cm in her right hand. Long Term Goal 2 Progress: Progressing toward goal Long Term Goal 3: Patient will increase right hand AROM to [facility] for increased use with ADLs. Long Term Goal 3 Progress: Progressing toward goal Long Term Goal 4: Patient will increase right hand strength to [facility] for increase participation in ADLs. Long Term Goal 4 Progress: Progressing toward goal Long Term Goal 5: Patient will increase right hand [facility] to [facility] for increased ability to use right hand actively with ADLs. Long Term Goal 5 Progress: Progressing toward goal Additional Long Term Goals?: Yes Long Term Goal 6: Increase right forearm and wrist AROM to [facility] for increased ability to use right arm with daily activities. Long Term Goal 6 Progress: Revised (modified due to lack of progress/goal met) Long Term Goal 7: Incrase right forearm and wrist strength to [facility] for increased ability to lift household items. Long Term Goal 7 Progress: Progressing toward goal End of Session Patient Active Problem List  Diagnoses  . Colles' fracture  . Pain in joint, hand  . Edema of upper extremity  . Muscle weakness (generalized)   End of Session Activity Tolerance: Patient tolerated treatment well General Behavior During  Session: [facility] for tasks performed Cognition: [facility] for tasks performed   Shirlean Mylar, OTR/L  01/15/2011, 12:09 PM

## 2011-01-21 ENCOUNTER — Ambulatory Visit (HOSPITAL_COMMUNITY)
Admission: RE | Admit: 2011-01-21 | Discharge: 2011-01-21 | Disposition: A | Discharge status: Home / Self Care | Payer: Medicare Other | Source: Ambulatory Visit | Attending: Family Medicine | Admitting: Family Medicine

## 2011-01-21 DIAGNOSIS — M6281 Muscle weakness (generalized): Secondary | ICD-10-CM

## 2011-01-21 DIAGNOSIS — R600 Localized edema: Secondary | ICD-10-CM

## 2011-01-21 DIAGNOSIS — M25549 Pain in joints of unspecified hand: Secondary | ICD-10-CM

## 2011-01-21 NOTE — Progress Notes (Signed)
Occupational Therapy Treatment  Patient Details  Name: Tonya Henderson MRN: [id number] Date of Birth: July 22, 1919  Today's Date: 01/21/2011 Time: 4098-1191 Time Calculation (min): 39 min Manual Therapy [phone] 29' Therapeutic Exercises [phone] 15' Visit#: 17  of 24   Re-eval: 02/12/11    Subjective Symptoms/Limitations Symptoms: S:  I can use my right hand to eat with, but its easier to use my left hand. Limitations: Discussed importance of using right hand with daily activities to decrease stiffness and swelling and increase mobility. Pain Assessment Currently in Pain?: Yes Pain Score:   2 Pain Location: Shoulder Pain Orientation: Right Pain Type: Acute pain  Exercise/Treatments Hand Exercises Sponges: 9, 12, using kinesio tape hand 14  Manual Therapy Edema Management: utilized  kinesiotape over digits, webspace, scar to decrease edema and promote increased mobility. [phone] Myofascial Release: MFR and manual stretching to right flexor and extensor forearm, wrist, and hand with stretching into supination, pronation, wrist flexion, extension, and gross grasp and [phone]  Occupational Therapy Assessment and Plan OT Assessment and Plan Clinical Impression Statement: A: Introduced and applied kinesiotape for edema control to right hand, wrist, and scar. OT Plan: P:  Assess fit and compliance with kinesiotape.   Goals Short Term Goals Time to Complete Short Term Goals: 4 weeks Short Term Goal 1: Patient will be educated on HEP. Short Term Goal 2: Patient will decrease edema by 1.0 cm in her right hand. Short Term Goal 3: Patient will increase right digit AROM by 10 for increased ability to use right hand with daily tasks. Short Term Goal 4: Assess grip and pinch strength and FMC. Short Term Goal 5: Decrease fascial restrictions from mod to min. Long Term Goals Time to Complete Long Term Goals: 8 weeks Long Term Goal 1: Patient will use her right hand actively with all  B/IADLs and leisure activities. Long Term Goal 2: Patient will decrease edema by 2.0 cm in her right hand. Long Term Goal 3: Patient will increase right hand AROM to [facility] for increased use with ADLs. Long Term Goal 4: Patient will increase right hand strength to [facility] for increase participation in ADLs. Long Term Goal 5: Patient will increase right hand FMC to [facility] for increased ability to use right hand actively with ADLs. Additional Long Term Goals?: Yes Long Term Goal 6: Increase right forearm and wrist AROM to [facility] for increased ability to use right arm with daily activities. Long Term Goal 7: Incrase right forearm and wrist strength to [facility] for increased ability to lift household items. End of Session Patient Active Problem List  Diagnoses  . Colles' fracture  . Pain in joint, hand  . Edema of upper extremity  . Muscle weakness (generalized)   End of Session Activity Tolerance: Patient tolerated treatment well General Behavior During Session: [facility] for tasks performed Cognition: [facility] for tasks performed   Shirlean Mylar, OTR/L  01/21/2011, 4:07 PM

## 2011-01-22 ENCOUNTER — Ambulatory Visit (HOSPITAL_COMMUNITY)
Admission: RE | Admit: 2011-01-22 | Discharge: 2011-01-22 | Disposition: A | Discharge status: Home / Self Care | Payer: Medicare Other | Source: Ambulatory Visit | Attending: Family Medicine | Admitting: Family Medicine

## 2011-01-22 DIAGNOSIS — M25549 Pain in joints of unspecified hand: Secondary | ICD-10-CM

## 2011-01-22 DIAGNOSIS — M6281 Muscle weakness (generalized): Secondary | ICD-10-CM

## 2011-01-22 DIAGNOSIS — R600 Localized edema: Secondary | ICD-10-CM

## 2011-01-22 NOTE — Progress Notes (Signed)
Occupational Therapy Treatment  Patient Details  Name: Tonya Henderson MRN: [id number] Date of Birth: Oct 09, 1919  Today's Date: 01/22/2011 Time: 2952-8413 Time Calculation (min): 69 min Manual Therapy 1310-1336 26' Therapeutic Exercises [phone] 61' Visit#: 18  of 24   Re-eval: 02/12/11    Subjective Symptoms/Limitations Symptoms: S:  I like the tape, its better than the glove.  I tried eating with my right hand, but I couldnt find my mouth. Pain Assessment Currently in Pain?: Yes Pain Score:   1 Pain Location: Wrist Pain Orientation: Right Pain Type: Acute pain  Exercise/Treatments Wrist Exercises Forearm Supination: PROM;Strengthening;10 reps Bar Weights/Barbell (Forearm Supination): 2 lbs Forearm Pronation: PROM;Strengthening;10 reps Bar Weights/Barbell (Forearm Pronation): 2 lbs Wrist Flexion: PROM;Strengthening;10 reps Bar Weights/Barbell (Wrist Flexion): 2 lbs Wrist Extension: PROM;Strengthening;10 reps Bar Weights/Barbell (Wrist Extension): 2 lbs Wrist Radial Deviation: PROM;Strengthening;10 reps Wrist Ulnar Deviation: PROM;Strengthening;15 reps   Sponges: 8, 10, 13 Theraputty: Flatten;Roll;Grip;Locate Pegs Theraputty - Flatten: pink Theraputty - Roll: pink Theraputty - Grip: pink Theraputty - Locate Pegs: 15 pegs Hand Gripper with Large Beads: blue hand gripper wtih 1 red band x 35  MCPJ Flexion: PROM;AROM;5 reps MCPJ Extension: PROM;AROM;5 reps PIPJ Flexion: PROM;AROM;5 reps PIPJ Extension: PROM;AROM;5 reps DIPJ Flexion: PROM;AROM;5 reps DIPJ Extension: PROM;AROM;5 reps Manual Therapy Edema Management: applied additional tape in y fashion over dorsal wrist., previous taping is working well. Myofascial Release: MFR and manual stretching to right flexor and extensor forearm, wrist, and hand with stretching into supination, pronation, wrist flexion, extension and gross grasp and release.  [id number]  Occupational Therapy Assessment and Plan OT Assessment  and Plan Clinical Impression Statement: A:  visible reduction in edema this date.  Able to passively make full fist for first time this date. OT Plan: P:  Continue to increase ability to form full fist Ily.   Goals Short Term Goals Time to Complete Short Term Goals: 4 weeks Short Term Goal 1: Patient will be educated on HEP. Short Term Goal 2: Patient will decrease edema by 1.0 cm in her right hand. Short Term Goal 3: Patient will increase right digit AROM by 10 for increased ability to use right hand with daily tasks. Short Term Goal 4: Assess grip and pinch strength and FMC. Short Term Goal 5: Decrease fascial restrictions from mod to min. Long Term Goals Time to Complete Long Term Goals: 8 weeks Long Term Goal 1: Patient will use her right hand actively with all B/IADLs and leisure activities. Long Term Goal 1 Progress: Progressing toward goal Long Term Goal 2: Patient will decrease edema by 2.0 cm in her right hand. Long Term Goal 2 Progress: Progressing toward goal Long Term Goal 3: Patient will increase right hand AROM to [facility] for increased use with ADLs. Long Term Goal 3 Progress: Progressing toward goal Long Term Goal 4: Patient will increase right hand strength to [facility] for increase participation in ADLs. Long Term Goal 4 Progress: Progressing toward goal Long Term Goal 5: Patient will increase right hand [facility] to [facility] for increased ability to use right hand actively with ADLs. Long Term Goal 5 Progress: Progressing toward goal Additional Long Term Goals?: Yes Long Term Goal 6: Increase right forearm and wrist AROM to [facility] for increased ability to use right arm with daily activities. Long Term Goal 6 Progress: Progressing toward goal Long Term Goal 7: Incrase right forearm and wrist strength to [facility] for increased ability to lift household items. Long Term Goal 7 Progress: Progressing toward goal End of Session Patient  Active Problem List  Diagnoses  . Colles' fracture  . Pain in  joint, hand  . Edema of upper extremity  . Muscle weakness (generalized)   End of Session Activity Tolerance: Patient tolerated treatment well General Behavior During Session: [facility] for tasks performed Cognition: [facility] for tasks performed   Shirlean Mylar, OTR/L  01/22/2011, 2:26 PM

## 2011-01-29 ENCOUNTER — Ambulatory Visit (HOSPITAL_COMMUNITY)
Admission: RE | Admit: 2011-01-29 | Discharge: 2011-01-29 | Disposition: A | Discharge status: Home / Self Care | Payer: Medicare Other | Source: Ambulatory Visit | Attending: Family Medicine | Admitting: Family Medicine

## 2011-01-29 DIAGNOSIS — M25549 Pain in joints of unspecified hand: Secondary | ICD-10-CM

## 2011-01-29 DIAGNOSIS — M6281 Muscle weakness (generalized): Secondary | ICD-10-CM

## 2011-01-29 DIAGNOSIS — R600 Localized edema: Secondary | ICD-10-CM

## 2011-01-29 NOTE — Progress Notes (Signed)
Occupational Therapy Treatment  Patient Details  Name: Tonya Henderson MRN: [id number] Date of Birth: Jul 02, 1919  Today's Date: 01/29/2011 Time: 4098-1191 Time Calculation (min): 47 min Manual Therapy 4782-9562 24' Therapeutic Exercises 1308-6578 23' Visit#: 19  of 24   Re-eval: 02/12/11    Subjective Symptoms/Limitations Symptoms: S:  I folded laundry today with my right hand. Pain Assessment Currently in Pain?: Yes Pain Score:   1 Pain Location: Wrist Pain Orientation: Right  O:  Exercise/Treatments Wrist Exercises Forearm Supination: PROM;10 reps;Strengthening;15 reps Bar Weights/Barbell (Forearm Supination): 2 lbs Forearm Pronation: PROM;10 reps;Strengthening;15 reps Bar Weights/Barbell (Forearm Pronation): 2 lbs Wrist Flexion: PROM;10 reps;Strengthening;15 reps Bar Weights/Barbell (Wrist Flexion): 2 lbs Wrist Extension: PROM;10 reps;Strengthening;15 reps Bar Weights/Barbell (Wrist Extension): 2 lbs   Sponges: 14, 14 Theraputty: Flatten;Roll;Grip;Locate Pegs Theraputty - Flatten: pink Theraputty - Roll: pink Theraputty - Grip: pink Theraputty - Locate Pegs: 15 pegs Hand Gripper with Large Beads: hold due to time  Wrist Weighted Stretch Supination - Weighted Stretch:  (2 min with 2 pound) Wrist Flexion - Weighted Stretch:  (2 min with 2 pounds) Wrist Extension - Weighted Stretch:  (2 min with 2 pounds)  Fine Motor Coordination Supination - Weighted Stretch:  (2 min with 2 pound) Wrist Flexion - Weighted Stretch:  (2 min with 2 pounds) Wrist Extension - Weighted Stretch:  (2 min with 2 pounds)  Hand Exercises MCPJ Flexion: PROM;AROM;5 reps MCPJ Extension: PROM;AROM;5 reps PIPJ Flexion: PROM;AROM;5 reps PIPJ Extension: PROM;AROM;5 reps DIPJ Flexion: PROM;AROM;5 reps DIPJ Extension: PROM;AROM;5 reps Joint Blocking Exercises: 5 reps PROM and AROM each digit each joint Theraputty: Flatten;Roll;Grip;Locate Pegs Theraputty - Flatten: pink Theraputty - Roll:  pink Theraputty - Grip: pink Theraputty - Locate Pegs: 15 pegs Hand Gripper with Large Beads: hold due to time Sponges: 14, 14  Manual Therapy Manual Therapy: Myofascial release Myofascial Release: MFR and manual stretching to right flexor and extensor forearm, wrist, and hand while passively stretching into supinaiton, pronation, wrist flexion, extension, and fist.  [id number]  Occupational Therapy Assessment and Plan OT Assessment and Plan Clinical Impression Statement: A:  Increased PROM in wrist this date. OT Plan: P:  Continue to increase PROM and AROM and functional use of right hand and wrist.   Goals Short Term Goals Time to Complete Short Term Goals: 4 weeks Short Term Goal 1: Patient will be educated on HEP. Short Term Goal 2: Patient will decrease edema by 1.0 cm in her right hand. Short Term Goal 3: Patient will increase right digit AROM by 10 for increased ability to use right hand with daily tasks. Short Term Goal 4: Assess grip and pinch strength and FMC. Short Term Goal 5: Decrease fascial restrictions from mod to min. Long Term Goals Time to Complete Long Term Goals: 8 weeks Long Term Goal 1: Patient will use her right hand actively with all B/IADLs and leisure activities. Long Term Goal 1 Progress: Progressing toward goal Long Term Goal 2: Patient will decrease edema by 2.0 cm in her right hand. Long Term Goal 2 Progress: Progressing toward goal Long Term Goal 3: Patient will increase right hand AROM to [facility] for increased use with ADLs. Long Term Goal 3 Progress: Progressing toward goal Long Term Goal 4: Patient will increase right hand strength to [facility] for increase participation in ADLs. Long Term Goal 4 Progress: Progressing toward goal Long Term Goal 5: Patient will increase right hand [facility] to [facility] for increased ability to use right hand actively with ADLs. Long Term Goal 5  Progress: Progressing toward goal Additional Long Term Goals?: Yes Long Term Goal 6: Increase  right forearm and wrist AROM to [facility] for increased ability to use right arm with daily activities. Long Term Goal 6 Progress: Progressing toward goal Long Term Goal 7: Incrase right forearm and wrist strength to [facility] for increased ability to lift household items. Long Term Goal 7 Progress: Progressing toward goal End of Session Patient Active Problem List  Diagnoses  . Colles' fracture  . Pain in joint, hand  . Edema of upper extremity  . Muscle weakness (generalized)   End of Session Activity Tolerance: Patient tolerated treatment well General Behavior During Session: [facility] for tasks performed Cognition: [facility] for tasks performed   Shirlean Mylar, OTR/L  01/29/2011, 4:39 PM

## 2011-01-30 ENCOUNTER — Ambulatory Visit (HOSPITAL_COMMUNITY)
Admission: RE | Admit: 2011-01-30 | Discharge: 2011-01-30 | Disposition: A | Discharge status: Home / Self Care | Payer: Medicare Other | Source: Ambulatory Visit | Attending: Family Medicine | Admitting: Family Medicine

## 2011-01-30 DIAGNOSIS — R600 Localized edema: Secondary | ICD-10-CM

## 2011-01-30 DIAGNOSIS — M6281 Muscle weakness (generalized): Secondary | ICD-10-CM

## 2011-01-30 DIAGNOSIS — M25549 Pain in joints of unspecified hand: Secondary | ICD-10-CM

## 2011-01-30 NOTE — Progress Notes (Signed)
Occupational Therapy Treatment  Patient Details  Name: CONSUELLO LASSALLE MRN: [id number] Date of Birth: 1919-05-01  Today's Date: 01/30/2011 Time: 4098-1191 Time Calculation (min): 45 min Manual Therapy 4782-9562 20' Therapeutic Exercises [phone] 25'  Visit#: 20  of 24   Re-eval: 02/12/11    Subjective Symptoms/Limitations Symptoms: S:  I can hold a paper cup with my right hand now. Pain Assessment Currently in Pain?: Yes Pain Score:   1 Pain Location: Shoulder Pain Type: Acute pain  O:  Exercise/Treatments Wrist Exercises Forearm Supination: PROM;10 reps Forearm Pronation: PROM;10 reps Wrist Flexion: PROM;10 reps Wrist Extension: PROM;10 reps Wrist Radial Deviation: PROM;10 reps Wrist Ulnar Deviation: PROM;10 reps   Sponges: 14, 14, 16 blue Theraputty: Flatten;Roll;Grip;Locate Pegs Theraputty - Flatten: pink Theraputty - Roll: pink Theraputty - Grip: pink Theraputty - Locate Pegs: 15 pegs Hand Gripper with Large Beads: black hand gripper to pick up blocks.  OT had to hold blocks approximately 1" up the handles in order for her to be able to grasp them x 10 blocks.  Hand Exercises MCPJ Flexion: PROM;AROM;5 reps MCPJ Extension: PROM;AROM;5 reps PIPJ Flexion: PROM;AROM;5 reps PIPJ Extension: PROM;AROM;5 reps DIPJ Flexion: PROM;AROM;5 reps DIPJ Extension: PROM;AROM;5 reps Joint Blocking Exercises: 5 reps PROM and AROM each digit each joint Theraputty: Flatten;Roll;Grip;Locate Pegs Theraputty - Flatten: pink Theraputty - Roll: pink Theraputty - Grip: pink Theraputty - Locate Pegs: 15 pegs Hand Gripper with Large Beads: black hand gripper to pick up blocks.  OT had to hold blocks approximately 1" up the handles in order for her to be able to grasp them x 10 blocks. Sponges: 14, 14, 16 blue  Manual Therapy Manual Therapy: Myofascial release Myofascial Release: MFR and manual stretching to right flexor and extensor forearm, wrist, and hand while passively stretching  into supination, pronation, wrist flexion, extension.  Place and hold into full  fist x 5" x 5 reps.  Occupational Therapy Assessment and Plan OT Assessment and Plan Clinical Impression Statement: A:  Upgraded to black hand gripper for grip strengthening this date and able to pickup and hold 16 blue sponges. OT Plan: P:  Continue to increase PROM and AROM and functional use of right hand.  Increase grip so that patient can grip blocks with handgripper without OT assist.   Goals Short Term Goals Time to Complete Short Term Goals: 4 weeks Short Term Goal 1: Patient will be educated on HEP. Short Term Goal 2: Patient will decrease edema by 1.0 cm in her right hand. Short Term Goal 3: Patient will increase right digit AROM by 10 for increased ability to use right hand with daily tasks. Short Term Goal 4: Assess grip and pinch strength and FMC. Short Term Goal 5: Decrease fascial restrictions from mod to min. Long Term Goals Time to Complete Long Term Goals: 8 weeks Long Term Goal 1: Patient will use her right hand actively with all B/IADLs and leisure activities. Long Term Goal 2: Patient will decrease edema by 2.0 cm in her right hand. Long Term Goal 3: Patient will increase right hand AROM to [facility] for increased use with ADLs. Long Term Goal 4: Patient will increase right hand strength to [facility] for increase participation in ADLs. Long Term Goal 5: Patient will increase right hand FMC to [facility] for increased ability to use right hand actively with ADLs. Additional Long Term Goals?: Yes Long Term Goal 6: Increase right forearm and wrist AROM to [facility] for increased ability to use right arm with daily activities. Long Term  Goal 7: Incrase right forearm and wrist strength to [facility] for increased ability to lift household items. End of Session Patient Active Problem List  Diagnoses  . Colles' fracture  . Pain in joint, hand  . Edema of upper extremity  . Muscle weakness (generalized)   End of  Session Activity Tolerance: Patient tolerated treatment well General Behavior During Session: [facility] for tasks performed Cognition: [facility] for tasks performed   Shirlean Mylar, OTR/L  01/30/2011, 4:45 PM

## 2011-02-05 ENCOUNTER — Ambulatory Visit (INDEPENDENT_AMBULATORY_CARE_PROVIDER_SITE_OTHER): Payer: Medicare Other | Admitting: Orthopedic Surgery

## 2011-02-05 ENCOUNTER — Encounter: Payer: Self-pay | Admitting: Orthopedic Surgery

## 2011-02-05 VITALS — Ht 62.0 in | Wt 142.0 lb

## 2011-02-05 DIAGNOSIS — S52539A Colles' fracture of unspecified radius, initial encounter for closed fracture: Secondary | ICD-10-CM

## 2011-02-05 NOTE — Patient Instructions (Signed)
Normal activity    

## 2011-02-05 NOTE — Progress Notes (Signed)
Patient ID: Tonya Henderson, female   DOB: May 17, 1919, 78 y.o.   MRN: [id number] RIGHT wrist fracture  RIGHT wrist open treatment internal fixation  Surgery date October 3  X-rays today  Clinical exam shows mild restrictions in extension and flexion of the wrist patient can make a fist approximately 90%.  She is happy with the results.  She's finished her therapy.  Her x-ray show fracture healing.  Followup as needed  X-ray report separate and identifiable  3 views RIGHT wrist  Reason for x-ray wrist fracture followup after internal fixation  Volar plate noted fracture healed plate position and screw position acceptable alignment acceptable  Impression healed fracture with internal fixation in stable position

## 2013-03-14 IMAGING — CR DG WRIST COMPLETE 3+V*R*
3 series · 3 of 3 positions shown · non-contrast
Comparison: None

CLINICAL DATA: Fell.  Right wrist pain.

RIGHT WRIST - COMPLETE 3+ VIEW

[view not recorded (1 of 3)]
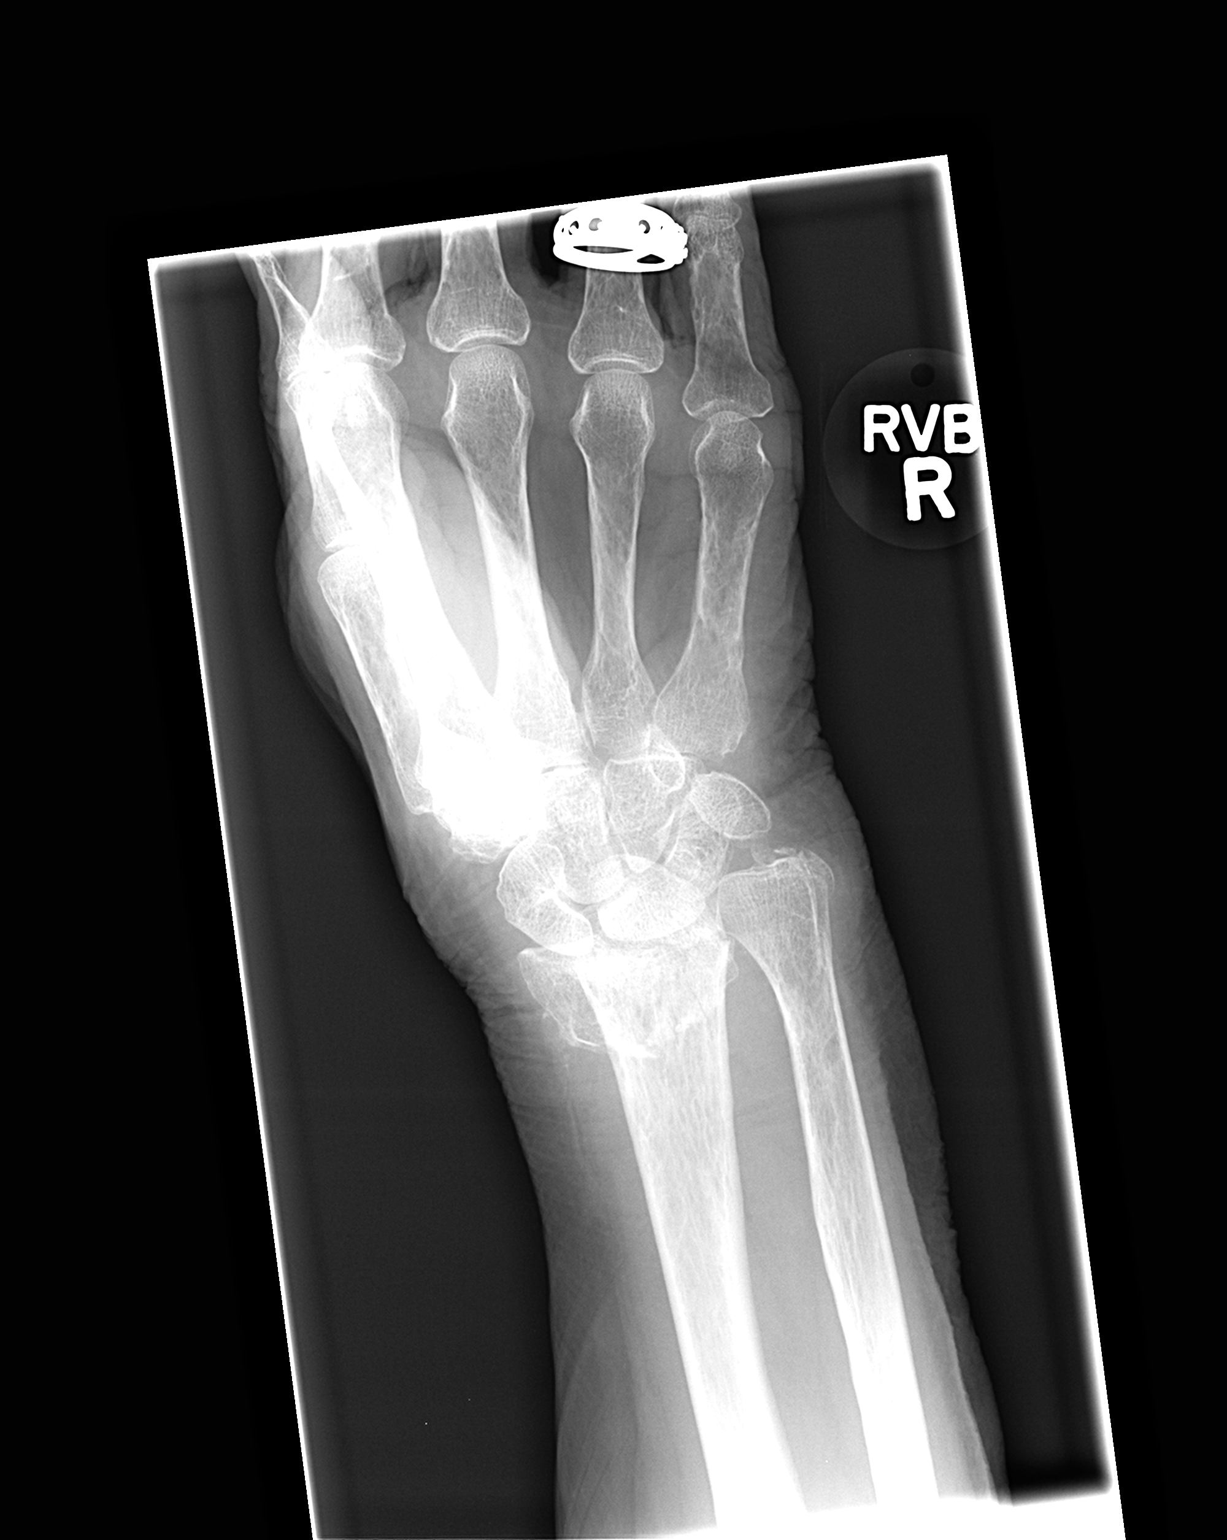

[view not recorded (2 of 3)]
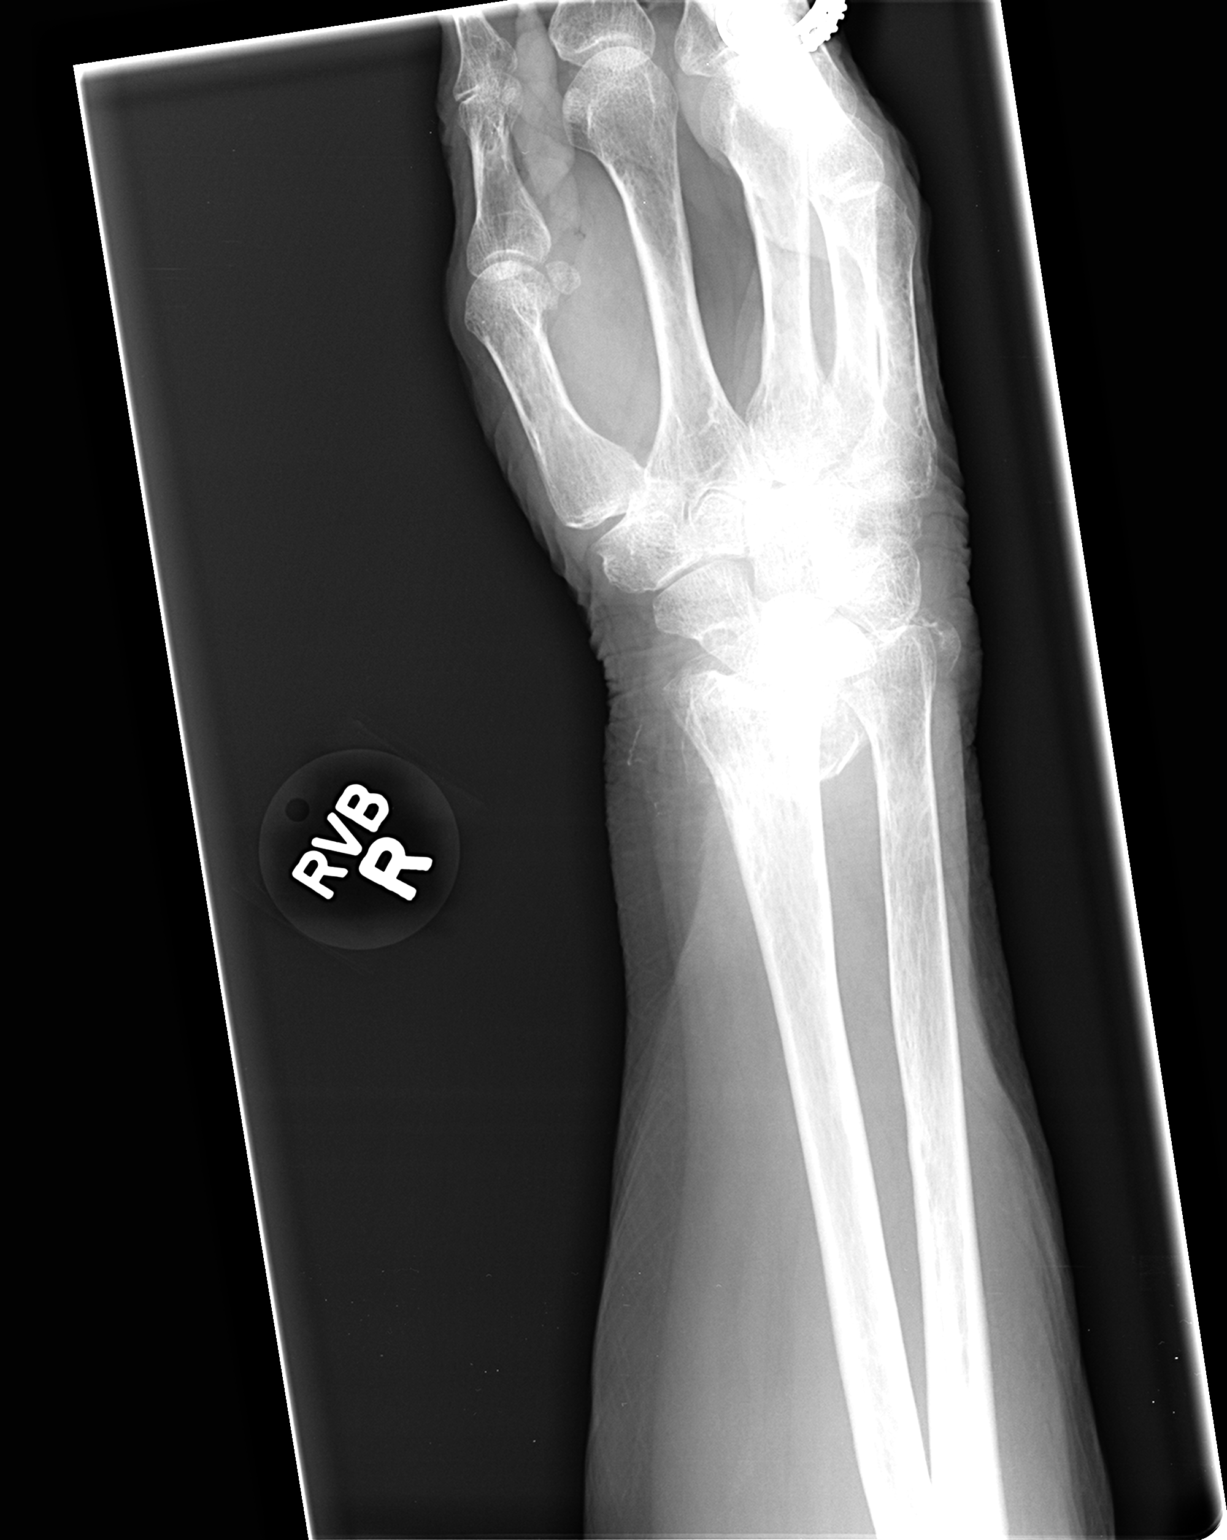

[view not recorded (3 of 3)]
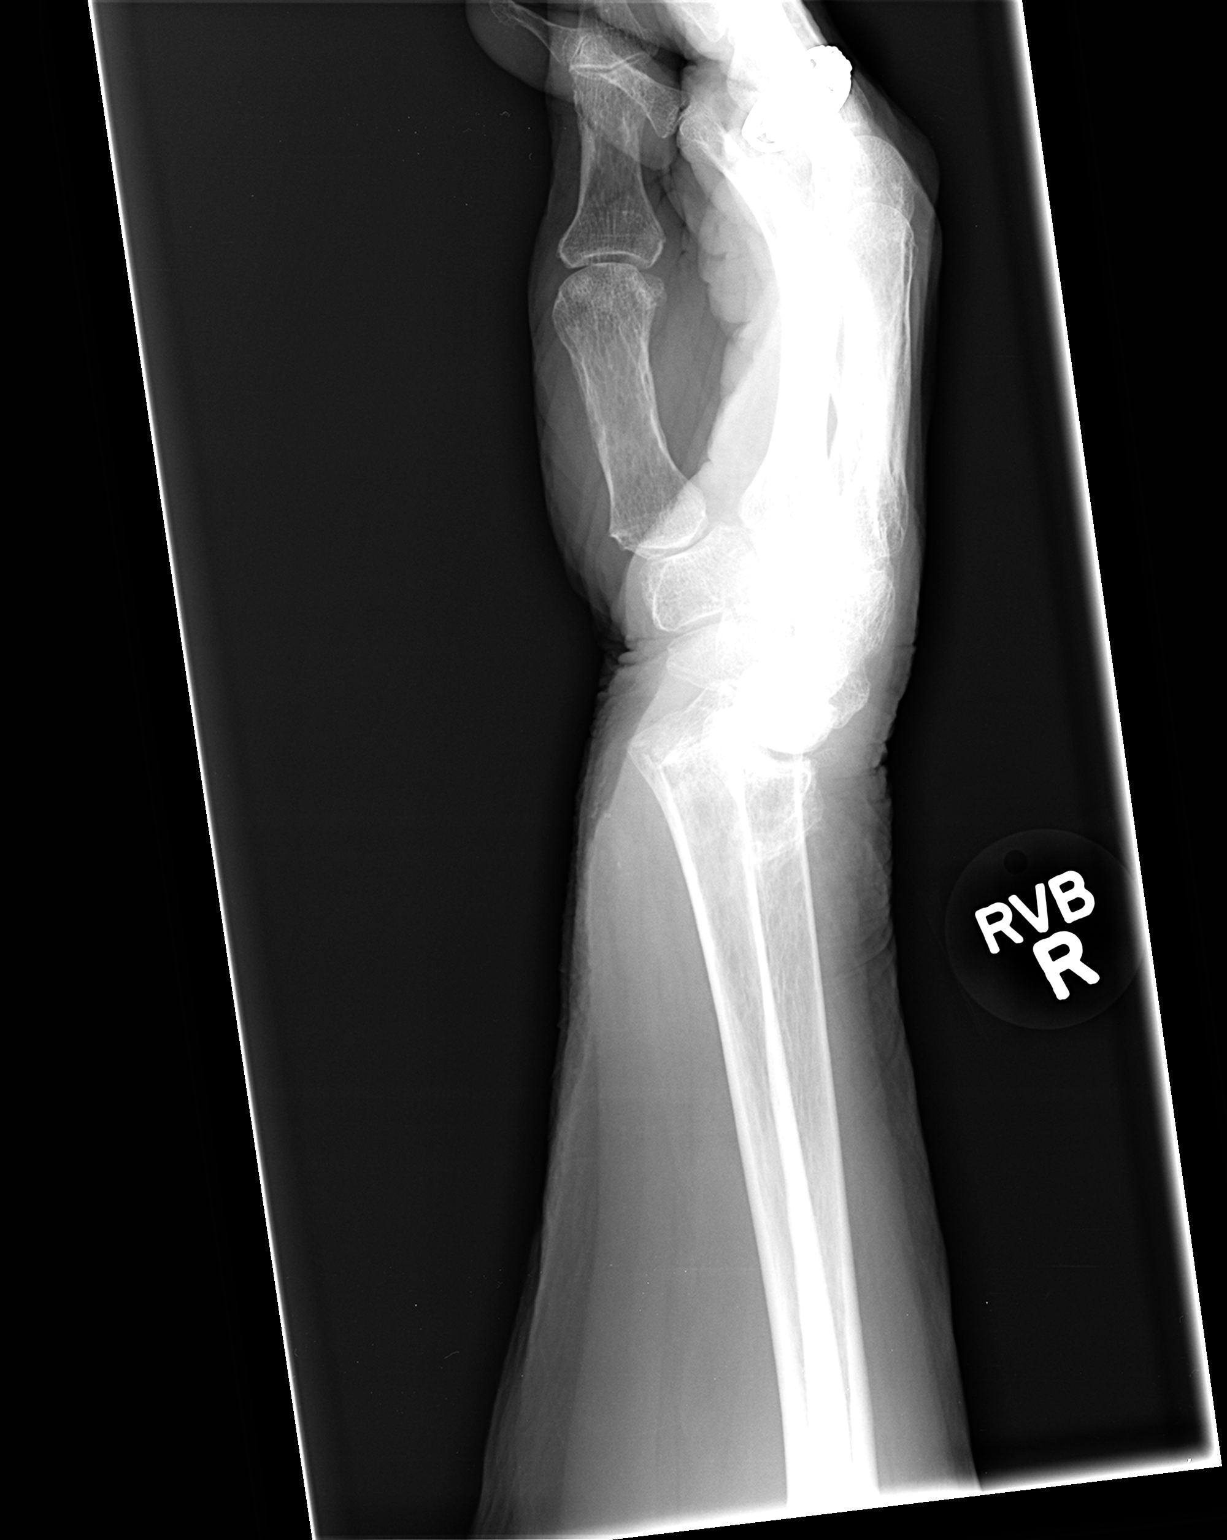

[3 of 3 positions shown; findings below may reference images not displayed]

FINDINGS: There is a comminuted intra-articular fracture distal
radius with marked dorsal displacement.  There is an associated
ulnar styloid avulsion fracture.  The carpal and metacarpal bones
are intact.
IMPRESSION: 1.  Comminuted intra-articular fracture distal radius with marked
dorsal displacement and impaction.
2.  Ulnar styloid avulsion fracture.

## 2015-11-03 ENCOUNTER — Inpatient Hospital Stay (HOSPITAL_COMMUNITY): Payer: Medicare Other

## 2015-11-03 ENCOUNTER — Emergency Department (HOSPITAL_COMMUNITY): Payer: Medicare Other

## 2015-11-03 ENCOUNTER — Inpatient Hospital Stay (HOSPITAL_COMMUNITY)
Admission: EM | Admit: 2015-11-03 | Discharge: 2015-11-08 | DRG: 064 | Disposition: A | Discharge status: Hospice | Payer: Medicare Other | Attending: Internal Medicine | Admitting: Internal Medicine

## 2015-11-03 ENCOUNTER — Encounter (HOSPITAL_COMMUNITY): Payer: Self-pay | Admitting: Emergency Medicine

## 2015-11-03 DIAGNOSIS — N183 Chronic kidney disease, stage 3 (moderate): Secondary | ICD-10-CM | POA: Diagnosis present

## 2015-11-03 DIAGNOSIS — R7989 Other specified abnormal findings of blood chemistry: Secondary | ICD-10-CM | POA: Diagnosis present

## 2015-11-03 DIAGNOSIS — R4182 Altered mental status, unspecified: Secondary | ICD-10-CM | POA: Diagnosis present

## 2015-11-03 DIAGNOSIS — Z8679 Personal history of other diseases of the circulatory system: Secondary | ICD-10-CM

## 2015-11-03 DIAGNOSIS — Z515 Encounter for palliative care: Secondary | ICD-10-CM

## 2015-11-03 DIAGNOSIS — I639 Cerebral infarction, unspecified: Secondary | ICD-10-CM

## 2015-11-03 DIAGNOSIS — I63433 Cerebral infarction due to embolism of bilateral posterior cerebral arteries: Principal | ICD-10-CM | POA: Diagnosis present

## 2015-11-03 DIAGNOSIS — Z66 Do not resuscitate: Secondary | ICD-10-CM | POA: Diagnosis present

## 2015-11-03 DIAGNOSIS — R4781 Slurred speech: Secondary | ICD-10-CM | POA: Diagnosis present

## 2015-11-03 DIAGNOSIS — Z7189 Other specified counseling: Secondary | ICD-10-CM

## 2015-11-03 DIAGNOSIS — R29717 NIHSS score 17: Secondary | ICD-10-CM | POA: Diagnosis not present

## 2015-11-03 DIAGNOSIS — F17290 Nicotine dependence, other tobacco product, uncomplicated: Secondary | ICD-10-CM | POA: Diagnosis present

## 2015-11-03 DIAGNOSIS — Z7982 Long term (current) use of aspirin: Secondary | ICD-10-CM

## 2015-11-03 DIAGNOSIS — R748 Abnormal levels of other serum enzymes: Secondary | ICD-10-CM | POA: Diagnosis not present

## 2015-11-03 DIAGNOSIS — I129 Hypertensive chronic kidney disease with stage 1 through stage 4 chronic kidney disease, or unspecified chronic kidney disease: Secondary | ICD-10-CM | POA: Diagnosis present

## 2015-11-03 DIAGNOSIS — R451 Restlessness and agitation: Secondary | ICD-10-CM | POA: Diagnosis present

## 2015-11-03 DIAGNOSIS — G9340 Encephalopathy, unspecified: Secondary | ICD-10-CM

## 2015-11-03 DIAGNOSIS — I6350 Cerebral infarction due to unspecified occlusion or stenosis of unspecified cerebral artery: Secondary | ICD-10-CM

## 2015-11-03 DIAGNOSIS — Z8249 Family history of ischemic heart disease and other diseases of the circulatory system: Secondary | ICD-10-CM | POA: Diagnosis not present

## 2015-11-03 DIAGNOSIS — Z79899 Other long term (current) drug therapy: Secondary | ICD-10-CM | POA: Diagnosis not present

## 2015-11-03 DIAGNOSIS — Z23 Encounter for immunization: Secondary | ICD-10-CM

## 2015-11-03 DIAGNOSIS — I69392 Facial weakness following cerebral infarction: Secondary | ICD-10-CM | POA: Diagnosis not present

## 2015-11-03 DIAGNOSIS — G450 Vertebro-basilar artery syndrome: Secondary | ICD-10-CM | POA: Diagnosis present

## 2015-11-03 DIAGNOSIS — R260 Ataxic gait: Secondary | ICD-10-CM | POA: Diagnosis present

## 2015-11-03 DIAGNOSIS — R29708 NIHSS score 8: Secondary | ICD-10-CM | POA: Diagnosis present

## 2015-11-03 DIAGNOSIS — R778 Other specified abnormalities of plasma proteins: Secondary | ICD-10-CM | POA: Diagnosis present

## 2015-11-03 DIAGNOSIS — I4891 Unspecified atrial fibrillation: Secondary | ICD-10-CM | POA: Diagnosis present

## 2015-11-03 DIAGNOSIS — E7800 Pure hypercholesterolemia, unspecified: Secondary | ICD-10-CM | POA: Diagnosis present

## 2015-11-03 DIAGNOSIS — Z836 Family history of other diseases of the respiratory system: Secondary | ICD-10-CM

## 2015-11-03 DIAGNOSIS — I63013 Cerebral infarction due to thrombosis of bilateral vertebral arteries: Secondary | ICD-10-CM | POA: Diagnosis not present

## 2015-11-03 DIAGNOSIS — I248 Other forms of acute ischemic heart disease: Secondary | ICD-10-CM | POA: Diagnosis present

## 2015-11-03 DIAGNOSIS — I10 Essential (primary) hypertension: Secondary | ICD-10-CM | POA: Diagnosis present

## 2015-11-03 DIAGNOSIS — G9349 Other encephalopathy: Secondary | ICD-10-CM | POA: Diagnosis present

## 2015-11-03 DIAGNOSIS — I313 Pericardial effusion (noninflammatory): Secondary | ICD-10-CM | POA: Diagnosis present

## 2015-11-03 HISTORY — DX: Hyperlipidemia, unspecified: E78.5

## 2015-11-03 HISTORY — DX: Essential (primary) hypertension: I10

## 2015-11-03 LAB — ECHOCARDIOGRAM COMPLETE
AV Peak grad: 9 mmHg
AV VEL mean LVOT/AV: 0.51
AV area mean vel ind: 1.05 cm2/m2
AV peak Index: 1.11
AVAREAMEANV: 1.77 cm2
AVAREAVTI: 1.87 cm2
AVAREAVTIIND: 1.33 cm2/m2
AVG: 5 mmHg
AVLVOTPG: 3 mmHg
AVPKVEL: 146 cm/s
Ao pk vel: 0.54 m/s
CHL CUP AV VEL: 2.24
DOP CAL AO MEAN VELOCITY: 99.9 cm/s
EWDT: 173 ms
FS: 33 % (ref 28–44)
HEIGHTINCHES: 64 in
IVS/LV PW RATIO, ED: 1.13
LA ID, A-P, ES: 50 mm
LA diam index: 2.96 cm/m2
LA vol A4C: 90.2 ml
LA vol index: 60.9 mL/m2
LA vol: 103 mL
LEFT ATRIUM END SYS DIAM: 50 mm
LVOT VTI: 14.8 cm
LVOT area: 3.46 cm2
LVOT peak VTI: 0.65 cm
LVOTD: 21 mm
LVOTPV: 79.1 cm/s
LVOTSV: 51 mL
MV Dec: 173
MV pk E vel: 145 m/s
MVPG: 8 mmHg
PW: 15.7 mm — AB (ref 0.6–1.1)
Reg peak vel: 324 cm/s
TAPSE: 13.6 mm
TR max vel: 324 cm/s
VTI: 22.9 cm
Valve area index: 1.33
Valve area: 2.24 cm2
WEIGHTICAEL: 2272 [oz_av]

## 2015-11-03 LAB — RAPID URINE DRUG SCREEN, HOSP PERFORMED
Amphetamines: NOT DETECTED
Barbiturates: NOT DETECTED
Benzodiazepines: NOT DETECTED
Cocaine: NOT DETECTED
Opiates: NOT DETECTED
Tetrahydrocannabinol: NOT DETECTED

## 2015-11-03 LAB — DIFFERENTIAL
BASOS ABS: 0 10*3/uL (ref 0.0–0.1)
Basophils Relative: 0 %
EOS PCT: 2 %
Eosinophils Absolute: 0.1 10*3/uL (ref 0.0–0.7)
LYMPHS ABS: 3.7 10*3/uL (ref 0.7–4.0)
LYMPHS PCT: 46 %
MONOS PCT: 10 %
Monocytes Absolute: 0.8 10*3/uL (ref 0.1–1.0)
NEUTROS PCT: 42 %
Neutro Abs: 3.4 10*3/uL (ref 1.7–7.7)

## 2015-11-03 LAB — COMPREHENSIVE METABOLIC PANEL
ALBUMIN: 3.3 g/dL — AB (ref 3.5–5.0)
ALK PHOS: 57 U/L (ref 38–126)
ALT: 10 U/L — ABNORMAL LOW (ref 14–54)
AST: 14 U/L — AB (ref 15–41)
Anion gap: 6 (ref 5–15)
BILIRUBIN TOTAL: 0.6 mg/dL (ref 0.3–1.2)
BUN: 43 mg/dL — AB (ref 6–20)
CALCIUM: 9.5 mg/dL (ref 8.9–10.3)
CO2: 21 mmol/L — ABNORMAL LOW (ref 22–32)
Chloride: 108 mmol/L (ref 101–111)
Creatinine, Ser: 1.63 mg/dL — ABNORMAL HIGH (ref 0.44–1.00)
GFR calc Af Amer: 30 mL/min — ABNORMAL LOW (ref >60)
GFR calc non Af Amer: 26 mL/min — ABNORMAL LOW (ref >60)
GLUCOSE: 172 mg/dL — AB (ref 65–99)
Potassium: 3.2 mmol/L — ABNORMAL LOW (ref 3.5–5.1)
Sodium: 135 mmol/L (ref 135–145)
TOTAL PROTEIN: 6.5 g/dL (ref 6.5–8.1)

## 2015-11-03 LAB — URINALYSIS, ROUTINE W REFLEX MICROSCOPIC
Bilirubin Urine: NEGATIVE
Glucose, UA: NEGATIVE mg/dL
KETONES UR: NEGATIVE mg/dL
LEUKOCYTES UA: NEGATIVE
NITRITE: NEGATIVE
PROTEIN: 30 mg/dL — AB
SPECIFIC GRAVITY, URINE: 1.025 (ref 1.005–1.030)
pH: 5 (ref 5.0–8.0)

## 2015-11-03 LAB — CBC
HEMATOCRIT: 31.9 % — AB (ref 36.0–46.0)
HEMOGLOBIN: 10.4 g/dL — AB (ref 12.0–15.0)
MCH: 28.9 pg (ref 26.0–34.0)
MCHC: 32.6 g/dL (ref 30.0–36.0)
MCV: 88.6 fL (ref 78.0–100.0)
Platelets: 203 10*3/uL (ref 150–400)
RBC: 3.6 MIL/uL — AB (ref 3.87–5.11)
RDW: 14 % (ref 11.5–15.5)
WBC: 8.1 10*3/uL (ref 4.0–10.5)

## 2015-11-03 LAB — PROTIME-INR
INR: 1.07
Prothrombin Time: 13.9 seconds (ref 11.4–15.2)

## 2015-11-03 LAB — GLUCOSE, CAPILLARY
GLUCOSE-CAPILLARY: 127 mg/dL — AB (ref 65–99)
GLUCOSE-CAPILLARY: 153 mg/dL — AB (ref 65–99)
Glucose-Capillary: 117 mg/dL — ABNORMAL HIGH (ref 65–99)

## 2015-11-03 LAB — TROPONIN I
Troponin I: 0.12 ng/mL (ref <0.03)
Troponin I: 0.11 ng/mL (ref <0.03)
Troponin I: 0.11 ng/mL (ref <0.03)
Troponin I: 0.12 ng/mL (ref <0.03)

## 2015-11-03 LAB — URINE MICROSCOPIC-ADD ON

## 2015-11-03 LAB — ETHANOL: Alcohol, Ethyl (B): <5 mg/dL (ref <5)

## 2015-11-03 LAB — I-STAT CG4 LACTIC ACID, ED: Lactic Acid, Venous: 1.54 mmol/L (ref 0.5–1.9)

## 2015-11-03 LAB — MRSA PCR SCREENING: MRSA BY PCR: NEGATIVE

## 2015-11-03 LAB — APTT: aPTT: 27 seconds (ref 24–36)

## 2015-11-03 MED ORDER — DILTIAZEM HCL 25 MG/5ML IV SOLN
INTRAVENOUS | Status: AC
  Filled 2015-11-03: qty 5

## 2015-11-03 MED ORDER — DILTIAZEM HCL 100 MG IV SOLR
INTRAVENOUS | Status: AC
  Administered 2015-11-03: 5 mg/h via INTRAVENOUS
  Filled 2015-11-03: qty 100

## 2015-11-03 MED ORDER — ASPIRIN 300 MG RE SUPP
RECTAL | Status: AC
  Filled 2015-11-03: qty 1

## 2015-11-03 MED ORDER — LORAZEPAM 2 MG/ML IJ SOLN
0.5000 mg | Freq: Once | INTRAMUSCULAR | Status: AC
  Administered 2015-11-04: 0.5 mg via INTRAVENOUS
  Filled 2015-11-03: qty 1

## 2015-11-03 MED ORDER — SODIUM CHLORIDE 0.9 % IV SOLN
INTRAVENOUS | Status: DC
  Administered 2015-11-03 – 2015-11-05 (×4): via INTRAVENOUS

## 2015-11-03 MED ORDER — MORPHINE SULFATE (PF) 2 MG/ML IV SOLN
0.5000 mg | Freq: Once | INTRAVENOUS | Status: AC
  Administered 2015-11-03: 0.5 mg via INTRAVENOUS
  Filled 2015-11-03: qty 1

## 2015-11-03 MED ORDER — ASPIRIN 300 MG RE SUPP
300.0000 mg | Freq: Once | RECTAL | Status: AC
  Administered 2015-11-03: 300 mg via RECTAL

## 2015-11-03 MED ORDER — STROKE: EARLY STAGES OF RECOVERY BOOK
Freq: Once | Status: AC
  Administered 2015-11-03: 11:00:00
  Filled 2015-11-03: qty 1

## 2015-11-03 MED ORDER — ORAL CARE MOUTH RINSE
15.0000 mL | Freq: Two times a day (BID) | OROMUCOSAL | Status: DC
  Administered 2015-11-03 – 2015-11-08 (×11): 15 mL via OROMUCOSAL

## 2015-11-03 MED ORDER — DILTIAZEM HCL 100 MG IV SOLR
5.0000 mg/h | INTRAVENOUS | Status: DC
  Administered 2015-11-03: 5 mg/h via INTRAVENOUS
  Administered 2015-11-03: 7.5 mg/h via INTRAVENOUS
  Administered 2015-11-04 (×2): 10 mg/h via INTRAVENOUS
  Administered 2015-11-05: 15 mg/h via INTRAVENOUS
  Administered 2015-11-05: 10 mg/h via INTRAVENOUS
  Administered 2015-11-05: 15 mg/h via INTRAVENOUS
  Administered 2015-11-06: 5 mg/h via INTRAVENOUS
  Filled 2015-11-03 (×8): qty 100

## 2015-11-03 MED ORDER — DILTIAZEM LOAD VIA INFUSION
10.0000 mg | Freq: Once | INTRAVENOUS | Status: AC
  Administered 2015-11-03: 10 mg via INTRAVENOUS
  Filled 2015-11-03: qty 10

## 2015-11-03 MED ORDER — ASPIRIN 300 MG RE SUPP
300.0000 mg | Freq: Every day | RECTAL | Status: DC
  Administered 2015-11-03 – 2015-11-07 (×5): 300 mg via RECTAL
  Filled 2015-11-03 (×4): qty 1

## 2015-11-03 MED ORDER — ASPIRIN 325 MG PO TABS: 325.0000 mg | ORAL_TABLET | Freq: Every day | ORAL | Status: DC

## 2015-11-03 MED ORDER — HEPARIN SODIUM (PORCINE) 5000 UNIT/ML IJ SOLN
5000.0000 [IU] | Freq: Three times a day (TID) | INTRAMUSCULAR | Status: DC
  Administered 2015-11-03 – 2015-11-05 (×7): 5000 [IU] via SUBCUTANEOUS
  Filled 2015-11-03 (×7): qty 1

## 2015-11-03 MED ORDER — INFLUENZA VAC SPLIT QUAD 0.5 ML IM SUSY
0.5000 mL | PREFILLED_SYRINGE | INTRAMUSCULAR | Status: AC
  Administered 2015-11-04: 0.5 mL via INTRAMUSCULAR
  Filled 2015-11-03: qty 0.5

## 2015-11-03 MED ORDER — PNEUMOCOCCAL VAC POLYVALENT 25 MCG/0.5ML IJ INJ
0.5000 mL | INJECTION | INTRAMUSCULAR | Status: AC
  Administered 2015-11-04: 0.5 mL via INTRAMUSCULAR
  Filled 2015-11-03: qty 0.5

## 2015-11-03 NOTE — ED Notes (Signed)
Family at bedside.  Pt tugging at nasal cannula and mumbling when family members talk with her.

## 2015-11-03 NOTE — ED Notes (Signed)
While on ct table pt moved lt foot around.  Pt made facial grimaces when transferring from ct table back to stretcher.

## 2015-11-03 NOTE — Progress Notes (Signed)
*  PRELIMINARY RESULTS* Echocardiogram 2D Echocardiogram has been performed.  Doristine SectionKristy H Zandrea Kenealy 11/03/2015, 9:51 AM

## 2015-11-03 NOTE — H&P (Addendum)
History and Physical    ILHAN DEBENEDETTO RUE:[id number] DOB: 19-Mar-1919 DOA: 11/03/2015  PCP: Pearson Grippe, MD  Patient coming from: Home   Chief Complaint:   AMS.  HPI: Tonya Henderson is an 82 y.o. female with hx of CHF, HTN, but generally doing well, lives at home with great family support, highly functional, presented to the ER as code stroke, as she woke up feeling malaise, having balance problems, and told her daughter she was n't feeling well.  EMS was summoned, and she was brought to the ER, by this time, obtunded.  She was not given TPA as ictus timing was not clear.  She improved in the ER, able to moves all 4 extremities, said good morning in a slurred speech, and answer rarely yes no questions.  She appears lethargic, and failed her swallow exam.  CT of her head showed old CVA, nothing acute, and EKG showed afib with RVR at 120.  Labs showed slight elevation of her Cr, CBG was normal.  Troponin slightly elevated at 0.12. She was given rectal ASA, and started on IV Cardiazem drip. EDP had spoken with family earlier, determined she is DNR. Her rate is now controlled at 90, SBP 120.  Hospitlist was asked to admit her for CVA work up and new onset afib, as far as we can determined, with RVR.     ED Course:  See above.  Rewiew of Systems: Unable.   Past Medical History:  Diagnosis Date  . CHF (congestive heart failure) (HCC)   . Degenerated eye   . Enlarged heart   . High cholesterol   . Hypertension     Past Surgical History:  Procedure Laterality Date  . EYE SURGERY     [facility], [place]  . ORIF WRIST FRACTURE  11/06/2010   Procedure: OPEN REDUCTION INTERNAL FIXATION (ORIF) WRIST FRACTURE;  Surgeon: Fuller Canada, MD;  Location: [facility];  Service: Orthopedics;  Laterality: Right;  With DVR Plates     reports that she has never smoked. She has never used smokeless tobacco. She reports that she does not drink alcohol or use drugs.  No Known Allergies  Family History  Problem  Relation Age of Onset  . Heart disease    . Lung disease    . Anesthesia problems Neg Hx   . Hypotension Neg Hx   . Pseudochol deficiency Neg Hx   . Malignant hyperthermia Neg Hx      Prior to Admission medications   Medication Sig Start Date End Date Taking? Authorizing Provider  amLODipine (NORVASC) 10 MG tablet Take 10 mg by mouth daily.      Historical Provider, MD  aspirin EC 81 MG tablet Take 81 mg by mouth daily.      Historical Provider, MD  folic acid (FOLVITE) 1 MG tablet Take 1 mg by mouth daily.      Historical Provider, MD  niacin (NIASPAN) 500 MG CR tablet Take 500 mg by mouth at bedtime.      Historical Provider, MD  olmesartan-hydrochlorothiazide (BENICAR HCT) 20-12.5 MG per tablet Take 1 tablet by mouth daily.      Historical Provider, MD  rosuvastatin (CRESTOR) 20 MG tablet Take 20 mg by mouth daily.      Historical Provider, MD    Physical Exam: Vitals:   11/03/15 0600 11/03/15 0608 11/03/15 0610 11/03/15 0615  BP: 130/83   (!) 124/45  Pulse: (!) 35 93 70 108  Resp: 17 18 18 17   Temp:  TempSrc:      SpO2: 93% 95% 95% 94%  Weight:      Height:          Constitutional: NAD, calm, comfortable Vitals:   11/03/15 0600 11/03/15 0608 11/03/15 0610 11/03/15 0615  BP: 130/83   (!) 124/45  Pulse: (!) 35 93 70 108  Resp: 17 18 18 17   Temp:      TempSrc:      SpO2: 93% 95% 95% 94%  Weight:      Height:       Eyes: PERRL, lids and conjunctivae normal ENMT: Mucous membranes are moist. Posterior pharynx clear of any exudate or lesions.Normal dentition.  Neck: normal, supple, no masses, no thyromegaly Respiratory: clear to auscultation bilaterally, no wheezing, no crackles. Normal respiratory effort. No accessory muscle use.  Cardiovascular: Regular rate and rhythm, no murmurs / rubs / gallops. No extremity edema. 2+ pedal pulses. No carotid bruits.  Abdomen: no tenderness, no masses palpated. No hepatosplenomegaly. Bowel sounds positive.  Musculoskeletal:  no clubbing / cyanosis. No joint deformity upper and lower extremities. Good ROM, no contractures. Normal muscle tone.  Skin: no rashes, lesions, ulcers. No induration Neurologic: lethargic, failed swallow test.  Moves all 4, slight facial droop to the left.  Doesn't follow command.  Answer yes no only.  Psychiatric: Unable.   Labs on Admission: I have personally reviewed following labs and imaging studies  CBC:  Recent Labs Lab 11/03/15 0443  WBC 8.1  NEUTROABS 3.4  HGB 10.4*  HCT 31.9*  MCV 88.6  PLT 203   Basic Metabolic Panel:  Recent Labs Lab 11/03/15 0443  NA 135  K 3.2*  CL 108  CO2 21*  GLUCOSE 172*  BUN 43*  CREATININE 1.63*  CALCIUM 9.5   GFR: Estimated Creatinine Clearance: 18.2 mL/min (by C-G formula based on SCr of 1.63 mg/dL (H)). Liver Function Tests:  Recent Labs Lab 11/03/15 0443  AST 14*  ALT 10*  ALKPHOS 57  BILITOT 0.6  PROT 6.5  ALBUMIN 3.3*   Coagulation Profile:  Recent Labs Lab 11/03/15 0443  INR 1.07   Cardiac Enzymes:  Recent Labs Lab 11/03/15 0443  TROPONINI 0.12*   BNP Urine analysis:    Component Value Date/Time   COLORURINE YELLOW 11/03/2015 0443   APPEARANCEUR CLEAR 11/03/2015 0443   LABSPEC 1.025 11/03/2015 0443   PHURINE 5.0 11/03/2015 0443   GLUCOSEU NEGATIVE 11/03/2015 0443   HGBUR TRACE (A) 11/03/2015 0443   BILIRUBINUR NEGATIVE 11/03/2015 0443   KETONESUR NEGATIVE 11/03/2015 0443   PROTEINUR 30 (A) 11/03/2015 0443   NITRITE NEGATIVE 11/03/2015 0443   LEUKOCYTESUR NEGATIVE 11/03/2015 0443    Radiological Exams on Admission: Ct Head Wo Contrast  Result Date: 11/03/2015 CLINICAL DATA:  82 y/o  F; unresponsive to painful stimulus. EXAM: CT HEAD WITHOUT CONTRAST TECHNIQUE: Contiguous axial images were obtained from the base of the skull through the vertex without intravenous contrast. COMPARISON:  None. FINDINGS: Brain: No evidence of large acute infarct, focal mass effect, intracranial hemorrhage, or  hydrocephalus. There are mild chronic microvascular ischemic changes. Chronic left lentiform nucleus and caudate head lacunar infarct and small lacunar infarcts in the right cerebellar hemisphere. Mild diffuse parenchymal volume loss. Vascular: Extensive calcific atherosclerosis of cavernous internal carotid arteries and vertebrobasilar system. Skull: Normal. Negative for fracture or focal lesion. Sinuses/Orbits: No acute finding. Bilateral intra-ocular lens replacement. Other: None. IMPRESSION: No acute intracranial abnormality is identified. Chronic left basal ganglia and right cerebellar hemisphere lacunar infarcts. Mild parenchymal  volume loss and chronic microvascular ischemic changes for age. Electronically Signed   By: Mitzi Hansen M.D.   On: 11/03/2015 05:25   Dg Chest Port 1 View  Result Date: 11/03/2015 CLINICAL DATA:  Mental status change. Breathing problems. Unresponsive to painful stimuli. EXAM: PORTABLE CHEST 1 VIEW COMPARISON:  None. FINDINGS: Cardiac enlargement. No focal airspace disease or consolidation in the lungs. No blunting of costophrenic angles. No pneumothorax. Calcification of the aorta. Old bilateral rib fractures. IMPRESSION: Prominent cardiac enlargement. No evidence of active pulmonary disease. Electronically Signed   By: Burman Nieves M.D.   On: 11/03/2015 05:22    EKG: Independently reviewed. afib with RVR.   Assessment/Plan Principal Problem:   Acute CVA (cerebrovascular accident) (HCC) Active Problems:   Atrial fibrillation with RVR (HCC)   HTN (hypertension)   History of congestive heart failure    PLAN:   Acute CVA:  Will continue with ASA for now.  Consider adding plavix vs full anticoagulation later if she would be able to swallow.  Proceed with MRI of the head, ECHO and carotid studies, though intervention is less likely given her advanced age. Check lipid profile.   Afib with RVR:  Continue with IV Diltiazem today.  Reassess for swallow  mechanism and consider oral transition when able.   ECHO will be done. Likely rate control and anticoagulation goal in 2-3 days.  HTN:  Her BP is controlled.  Will allow permissive HTN.  Elevated troponin:  Likely demand ischemia from tachyarrythmia.  Unlikely to require interventional cardiology.  Will trend troponin.  Check lipid.  Hx of CHF:  Will be judicious in giving her needed IVF.  AKI or CKD:  I suspect she has some baseline CKD.  In 2012, her Cr was already at 1.17.    DVT prophylaxis: Heparin.  Code Status: DNR.  Family Communication: 2 daughters at bedside.  Disposition Plan:to home when appropriate.  Consults called: None.  Admission status: inpatient due to acute CVA and arrythmia, and will require more than 2 days to provide medical care to her.    Yohann Curl MD FACP. [facility]  If 7PM-7AM, please contact night-coverage [email or web address] Password [email or web address]  11/03/2015, 7:59 AM

## 2015-11-03 NOTE — ED Triage Notes (Signed)
Ems called to house for breathing problems.  Pt was answering yes and no questions upon ems arrival, but was leaning to one side.  Pt is now unresponsive to painful stimuli .

## 2015-11-03 NOTE — Progress Notes (Signed)
SLP Cancellation Note  Patient Details Name: Tonya Henderson MRN: [id number] DOB: 04/06/1919   Cancelled treatment:       Reason Eval/Treat Not Completed: Fatigue/lethargy limiting ability to participate;Other (comment); Consult received for SLE, however pt currently sleeping. She was not alert for RN swallow screen in ED over night and it was given today and pt passed. Regular diet with thin liquids was ordered. Will defer SLE until tomorrow or Monday as pt has had numerous (15-20) family members visiting throughout the day and she is finally resting.   Thank you,  Havery MorosDabney Porter, CCC-SLP [phone]    PORTER,DABNEY 11/03/2015, 5:39 PM

## 2015-11-03 NOTE — ED Provider Notes (Addendum)
[facility] DEPT Provider Note   CSN: [id number] Arrival date & time: 11/03/15  0438     History   Chief Complaint Chief Complaint  Patient presents with  . Cerebrovascular Accident    HPI Tonya Henderson is a 82 y.o. female.  Patient brought to the emergency department from home by ambulance. Information provided by daughter who she lives with. Daughter reports that the patient awakened to go to the bathroom and said that said she did not feel well. Daughter reports that the patient was confused and stopped talking. EMS was contacted. Upon their arrival patient was sitting up and able to answer yes and no questions. During transport however, patient became obtunded and is no longer responsive.  Level V Caveat due to unresponsiveness.       Past Medical History:  Diagnosis Date  . CHF (congestive heart failure) (HCC)   . Degenerated eye   . Enlarged heart   . High cholesterol   . Hypertension     Patient Active Problem List   Diagnosis Date Noted  . Pain in joint, hand 11/26/2010  . Edema of upper extremity 11/26/2010  . Muscle weakness (generalized) 11/26/2010  . Colles' fracture 11/11/2010    Past Surgical History:  Procedure Laterality Date  . EYE SURGERY     [facility], [place]  . ORIF WRIST FRACTURE  11/06/2010   Procedure: OPEN REDUCTION INTERNAL FIXATION (ORIF) WRIST FRACTURE;  Surgeon: Fuller CanadaStanley Harrison, MD;  Location: [facility];  Service: Orthopedics;  Laterality: Right;  With DVR Plates    OB History    No data available       Home Medications    Prior to Admission medications   Medication Sig Start Date End Date Taking? Authorizing Provider  amLODipine (NORVASC) 10 MG tablet Take 10 mg by mouth daily.      Historical Provider, MD  aspirin EC 81 MG tablet Take 81 mg by mouth daily.      Historical Provider, MD  folic acid (FOLVITE) 1 MG tablet Take 1 mg by mouth daily.      Historical Provider, MD  niacin (NIASPAN) 500 MG CR tablet Take 500 mg by mouth  at bedtime.      Historical Provider, MD  olmesartan-hydrochlorothiazide (BENICAR HCT) 20-12.5 MG per tablet Take 1 tablet by mouth daily.      Historical Provider, MD  rosuvastatin (CRESTOR) 20 MG tablet Take 20 mg by mouth daily.      Historical Provider, MD    Family History Family History  Problem Relation Age of Onset  . Heart disease    . Lung disease    . Anesthesia problems Neg Hx   . Hypotension Neg Hx   . Pseudochol deficiency Neg Hx   . Malignant hyperthermia Neg Hx     Social History Social History  Substance Use Topics  . Smoking status: Never Smoker  . Smokeless tobacco: Never Used  . Alcohol use No     Allergies   Review of patient's allergies indicates no known allergies.   Review of Systems Review of Systems  Unable to perform ROS: Patient unresponsive     Physical Exam Updated Vital Signs BP 130/83   Pulse (!) 35   Temp (!) 95.5 F (35.3 C) (Rectal)   Resp 17   Ht 5\' 4"  (1.626 m)   Wt 142 lb (64.4 kg)   SpO2 93%   BMI 24.37 kg/m   Physical Exam  Constitutional: She appears well-developed and well-nourished.  HENT:  Head: Normocephalic and atraumatic.  Eyes:  Pupils equal, sluggish  Neck: Neck supple.  Cardiovascular: A regularly irregular rhythm present. Tachycardia present.   Pulmonary/Chest: She has decreased breath sounds. She has rhonchi. She has rales.  Abdominal: Soft.  Musculoskeletal: She exhibits no edema.  Neurological: She is unresponsive.  Skin: Skin is warm, dry and intact.  Psychiatric: She is noncommunicative.     ED Treatments / Results  Labs (all labs ordered are listed, but only abnormal results are displayed) Labs Reviewed  CBC - Abnormal; Notable for the following:       Result Value   RBC 3.60 (*)    Hemoglobin 10.4 (*)    HCT 31.9 (*)    All other components within normal limits  COMPREHENSIVE METABOLIC PANEL - Abnormal; Notable for the following:    Potassium 3.2 (*)    CO2 21 (*)    Glucose, Bld  172 (*)    BUN 43 (*)    Creatinine, Ser 1.63 (*)    Albumin 3.3 (*)    AST 14 (*)    ALT 10 (*)    GFR calc non Af Amer 26 (*)    GFR calc Af Amer 30 (*)    All other components within normal limits  URINALYSIS, ROUTINE W REFLEX MICROSCOPIC (NOT AT [facility]) - Abnormal; Notable for the following:    Hgb urine dipstick TRACE (*)    Protein, ur 30 (*)    All other components within normal limits  URINE MICROSCOPIC-ADD ON - Abnormal; Notable for the following:    Squamous Epithelial / LPF 0-5 (*)    Bacteria, UA FEW (*)    All other components within normal limits  PROTIME-INR  APTT  DIFFERENTIAL  URINE RAPID DRUG SCREEN, HOSP PERFORMED  ETHANOL  TROPONIN I  I-STAT CG4 LACTIC ACID, ED    EKG  EKG Interpretation  Date/Time:  Saturday November 03 2015 04:39:47 EDT Ventricular Rate:  117 PR Interval:    QRS Duration: 84 QT Interval:  324 QTC Calculation: 452 R Axis:   32 Text Interpretation:  Atrial fibrillation Ventricular premature complex Repolarization abnormality, prob rate related compared to previous, now in AFIB Confirmed by Shakyra Mattera  MD, Alwilda Gilland [phone]) on 11/03/2015 5:14:54 AM       Radiology Ct Head Wo Contrast  Result Date: 11/03/2015 CLINICAL DATA:  82 y/o  F; unresponsive to painful stimulus. EXAM: CT HEAD WITHOUT CONTRAST TECHNIQUE: Contiguous axial images were obtained from the base of the skull through the vertex without intravenous contrast. COMPARISON:  None. FINDINGS: Brain: No evidence of large acute infarct, focal mass effect, intracranial hemorrhage, or hydrocephalus. There are mild chronic microvascular ischemic changes. Chronic left lentiform nucleus and caudate head lacunar infarct and small lacunar infarcts in the right cerebellar hemisphere. Mild diffuse parenchymal volume loss. Vascular: Extensive calcific atherosclerosis of cavernous internal carotid arteries and vertebrobasilar system. Skull: Normal. Negative for fracture or focal lesion.  Sinuses/Orbits: No acute finding. Bilateral intra-ocular lens replacement. Other: None. IMPRESSION: No acute intracranial abnormality is identified. Chronic left basal ganglia and right cerebellar hemisphere lacunar infarcts. Mild parenchymal volume loss and chronic microvascular ischemic changes for age. Electronically Signed   By: Mitzi Hansen M.D.   On: 11/03/2015 05:25   Dg Chest Port 1 View  Result Date: 11/03/2015 CLINICAL DATA:  Mental status change. Breathing problems. Unresponsive to painful stimuli. EXAM: PORTABLE CHEST 1 VIEW COMPARISON:  None. FINDINGS: Cardiac enlargement. No focal airspace disease or consolidation in the  lungs. No blunting of costophrenic angles. No pneumothorax. Calcification of the aorta. Old bilateral rib fractures. IMPRESSION: Prominent cardiac enlargement. No evidence of active pulmonary disease. Electronically Signed   By: Burman Nieves M.D.   On: 11/03/2015 05:22    Procedures Procedures (including critical care time)  Medications Ordered in ED Medications  diltiazem (CARDIZEM) 1 mg/mL load via infusion 10 mg (10 mg Intravenous Bolus from Bag 11/03/15 0551)    And  diltiazem (CARDIZEM) 100 mg in dextrose 5 % 100 mL (1 mg/mL) infusion (not administered)  diltiazem (CARDIZEM) 25 MG/5ML injection (not administered)  dextrose 5 % with diltiazem (CARDIZEM) ADS Med (not administered)     Initial Impression / Assessment and Plan / ED Course  I have reviewed the triage vital signs and the nursing notes.  Pertinent labs & imaging results that were available during my care of the patient were reviewed by me and considered in my medical decision making (see chart for details).  Clinical Course   Patient presents to the emergency department for evaluation of mental status changes. Patient reportedly awakened to go to the bathroom and was confused and did not know who the daughter was. EMS reports that the patient was awake but unable to answer  questions for them. They felt that she might be exhibiting a facial droop at that time. By the time she arrived in the ER, she was obtunded.  Last known normal neurologic status was 8 PM last night. She was not a candidate for TPA based on the length of time the symptoms have been present. Patient was sent to radiology for CT scan. No acute abnormality was noted. She does have evidence of previous lacunar infarcts.  Bloodwork was unrevealing. She does have mild elevation of BUN/creatinine creatinine from her baseline, otherwise no significant abnormalities. No signs of infection. Lactate is normal. Urinalysis does not show infection. Chest x-ray is clear.  Patient was noted to be irregular and tachycardic at arrival. EKG has confirmed atrial fibrillation with rapid ventricular response. Patient does not have a previous history of this. She does not appear to have active congestive heart failure at this time. She was therefore initiated on low-dose Cardizem for rate control. CHADS-VASc score is 5.   During the period of evaluation here in the ER, patient has become more alert. She is now able to answer some yes and no questions again. She is able to follow commands with the right side, but is not following commands with the left side and appears to have a left hemiparesis. This would be consistent with new stroke secondary to atrial fibrillation. As this is likely an acute CVA, will hold off on anti-coagulation, as recommendation for initial treatment of cardioembolic stroke would be aspirin initially followed by delayed anticoagulation.  CRITICAL CARE Performed by: Gilda Crease   Total critical care time: 30 minutes  Critical care time was exclusive of separately billable procedures and treating other patients.  Critical care was necessary to treat or prevent imminent or life-threatening deterioration.  Critical care was time spent personally by me on the following activities:  development of treatment plan with patient and/or surrogate as well as nursing, discussions with consultants, evaluation of patient's response to treatment, examination of patient, obtaining history from patient or surrogate, ordering and performing treatments and interventions, ordering and review of laboratory studies, ordering and review of radiographic studies, pulse oximetry and re-evaluation of patient's condition.   Final Clinical Impressions(s) / ED Diagnoses   Final diagnoses:  Atrial fibrillation with RVR (HCC)  Encephalopathy acute    New Prescriptions New Prescriptions   No medications on file     Gilda Crease, MD 11/03/15 4010    Gilda Crease, MD 11/03/15 2725    Gilda Crease, MD 11/03/15 2302

## 2015-11-04 LAB — LIPID PANEL
Cholesterol: 108 mg/dL (ref 0–200)
HDL: 57 mg/dL (ref >40)
LDL CALC: 39 mg/dL (ref 0–99)
Total CHOL/HDL Ratio: 1.9 RATIO
Triglycerides: 59 mg/dL (ref <150)
VLDL: 12 mg/dL (ref 0–40)

## 2015-11-04 LAB — COMPREHENSIVE METABOLIC PANEL
ALBUMIN: 3 g/dL — AB (ref 3.5–5.0)
ALT: 10 U/L — AB (ref 14–54)
AST: 17 U/L (ref 15–41)
Alkaline Phosphatase: 54 U/L (ref 38–126)
Anion gap: 5 (ref 5–15)
BUN: 32 mg/dL — ABNORMAL HIGH (ref 6–20)
CHLORIDE: 108 mmol/L (ref 101–111)
CO2: 23 mmol/L (ref 22–32)
CREATININE: 1.23 mg/dL — AB (ref 0.44–1.00)
Calcium: 9.3 mg/dL (ref 8.9–10.3)
GFR calc non Af Amer: 36 mL/min — ABNORMAL LOW (ref >60)
GFR, EST AFRICAN AMERICAN: 42 mL/min — AB (ref >60)
GLUCOSE: 109 mg/dL — AB (ref 65–99)
Potassium: 3.4 mmol/L — ABNORMAL LOW (ref 3.5–5.1)
SODIUM: 136 mmol/L (ref 135–145)
Total Bilirubin: 0.6 mg/dL (ref 0.3–1.2)
Total Protein: 5.9 g/dL — ABNORMAL LOW (ref 6.5–8.1)

## 2015-11-04 LAB — CBC
HCT: 30.4 % — ABNORMAL LOW (ref 36.0–46.0)
Hemoglobin: 9.9 g/dL — ABNORMAL LOW (ref 12.0–15.0)
MCH: 28.8 pg (ref 26.0–34.0)
MCHC: 32.6 g/dL (ref 30.0–36.0)
MCV: 88.4 fL (ref 78.0–100.0)
PLATELETS: 213 10*3/uL (ref 150–400)
RBC: 3.44 MIL/uL — AB (ref 3.87–5.11)
RDW: 14 % (ref 11.5–15.5)
WBC: 9.4 10*3/uL (ref 4.0–10.5)

## 2015-11-04 LAB — GLUCOSE, CAPILLARY
Glucose-Capillary: 126 mg/dL — ABNORMAL HIGH (ref 65–99)
Glucose-Capillary: 148 mg/dL — ABNORMAL HIGH (ref 65–99)
Glucose-Capillary: 128 mg/dL — ABNORMAL HIGH (ref 65–99)
Glucose-Capillary: 138 mg/dL — ABNORMAL HIGH (ref 65–99)

## 2015-11-04 NOTE — Progress Notes (Signed)
PROGRESS NOTE    SHAUGHNESSY GETHERS  ZOX:[id number] DOB: [birth year] DOA: 11/03/2015 PCP: Pearson Grippe, MD    Brief Narrative: 82 yo admitted with afib and RVR, acute posterior CVA presented as confusion and agitation.  MRI showed multi-infarcts posterior circulation.  ECHO OK, carotid with plaques and 50-60 percent stenosis.  Lipid is excellent on no meds. She is still agitated and lethargic.  Speech was not able to evaluate her fully as yet.    Assessment & Plan:   Principal Problem:   Acute CVA (cerebrovascular accident) (HCC) Active Problems:   Atrial fibrillation with RVR (HCC)   HTN (hypertension)   History of congestive heart failure   Elevated troponin   1. Acute CVA:  Will continue with current Tx.  She will need to be on anticoagulation.  Will start tomorrow either Eliquis or IV heparin or levanox tomorrow, depending on her ability to swallow. She did pass her swallow test, but she is still lethargic. RN reported she was able to walk to the bathroom yesterday.   Will consult neurology tomorrow as well. 2. Afib with RVR:  Continue with IV Cardiazem today.  Will anticoagulate her tomorrow.  MRI did not show large CVA and minimal edema.  3. HTN:  Will allow permissive HTN.   4. Elevated troponin:  Demand ischemia.  No further work up is planned.  DVT prophylaxis: SQ heparin Code Status: DNR (this was re confirmed yesterday with entired family.  Family Communication: everyones at bedside.  Disposition Plan: to home when better.   Consultants:   None.   Procedures: None.   Antimicrobials: Anti-infectives    None       Subjective:  Not alert.  Non-communicative.   Objective: Vitals:   11/04/15 0000 11/04/15 0400 11/04/15 0500 11/04/15 0746  BP:      Pulse:      Resp:      Temp: 97.3 F (36.3 C) 97.2 F (36.2 C)  98.4 F (36.9 C)  TempSrc: Axillary Axillary  Oral  SpO2:      Weight:   61.5 kg (135 lb 9.3 oz)   Height:        Intake/Output Summary (Last 24  hours) at 11/04/15 0818 Last data filed at 11/03/15 2010  Gross per 24 hour  Intake                0 ml  Output              300 ml  Net             -300 ml   Filed Weights   11/03/15 0444 11/04/15 0500  Weight: 64.4 kg (142 lb) 61.5 kg (135 lb 9.3 oz)    Examination:  General exam: Appears calm and comfortable  Respiratory system: Clear to auscultation. Respiratory effort normal. Cardiovascular system: S1 & S2 heard, RRR. No JVD, murmurs, rubs, gallops or clicks. No pedal edema. Gastrointestinal system: Abdomen is nondistended, soft and nontender. No organomegaly or masses felt. Normal bowel sounds heard. Central nervous system: Alert and oriented. No focal neurological deficits. Extremities: Symmetric 5 x 5 power. Skin: No rashes, lesions or ulcers Psychiatry: Unable to determine  Data Reviewed: I have personally reviewed following labs and imaging studies  CBC:  Recent Labs Lab 11/03/15 0443 11/04/15 0404  WBC 8.1 9.4  NEUTROABS 3.4  --   HGB 10.4* 9.9*  HCT 31.9* 30.4*  MCV 88.6 88.4  PLT 203 213   Basic Metabolic Panel:  Recent Labs Lab 11/03/15 0443 11/04/15 0404  NA 135 136  K 3.2* 3.4*  CL 108 108  CO2 21* 23  GLUCOSE 172* 109*  BUN 43* 32*  CREATININE 1.63* 1.23*  CALCIUM 9.5 9.3   GFR: Estimated Creatinine Clearance: 24.2 mL/min (by C-G formula based on SCr of 1.23 mg/dL (H)). Liver Function Tests:  Recent Labs Lab 11/03/15 0443 11/04/15 0404  AST 14* 17  ALT 10* 10*  ALKPHOS 57 54  BILITOT 0.6 0.6  PROT 6.5 5.9*  ALBUMIN 3.3* 3.0*   Coagulation Profile:  Recent Labs Lab 11/03/15 0443  INR 1.07   Cardiac Enzymes:  Recent Labs Lab 11/03/15 0443 11/03/15 0823 11/03/15 1507 11/03/15 2108  TROPONINI 0.12* 0.11* 0.11* 0.12*   BNP (last 3 results)  Recent Labs Lab 11/03/15 0856 11/03/15 1152 11/03/15 1527 11/04/15 0746  GLUCAP 127* 153* 117* 126*   Lipid Profile:  Recent Labs  11/04/15 0404  CHOL 108  HDL 57    LDLCALC 39  TRIG 59  CHOLHDL 1.9   Sepsis Labs:  Recent Labs Lab 11/03/15 0457  LATICACIDVEN 1.54    Recent Results (from the past 240 hour(s))  MRSA PCR Screening     Status: None   Collection Time: 11/03/15  8:22 AM  Result Value Ref Range Status   MRSA by PCR NEGATIVE NEGATIVE Final    Comment:        The GeneXpert MRSA Assay (FDA approved for NASAL specimens only), is one component of a comprehensive MRSA colonization surveillance program. It is not intended to diagnose MRSA infection nor to guide or monitor treatment for MRSA infections.      Radiology Studies: Ct Head Wo Contrast  Result Date: 11/03/2015 CLINICAL DATA:  82 y/o  F; unresponsive to painful stimulus. EXAM: CT HEAD WITHOUT CONTRAST TECHNIQUE: Contiguous axial images were obtained from the base of the skull through the vertex without intravenous contrast. COMPARISON:  None. FINDINGS: Brain: No evidence of large acute infarct, focal mass effect, intracranial hemorrhage, or hydrocephalus. There are mild chronic microvascular ischemic changes. Chronic left lentiform nucleus and caudate head lacunar infarct and small lacunar infarcts in the right cerebellar hemisphere. Mild diffuse parenchymal volume loss. Vascular: Extensive calcific atherosclerosis of cavernous internal carotid arteries and vertebrobasilar system. Skull: Normal. Negative for fracture or focal lesion. Sinuses/Orbits: No acute finding. Bilateral intra-ocular lens replacement. Other: None. IMPRESSION: No acute intracranial abnormality is identified. Chronic left basal ganglia and right cerebellar hemisphere lacunar infarcts. Mild parenchymal volume loss and chronic microvascular ischemic changes for age. Electronically Signed   By: Mitzi Hansen M.D.   On: 11/03/2015 05:25   Mr Brain Wo Contrast  Result Date: 11/03/2015 CLINICAL DATA:  82 year old female found unresponsive. Initial encounter. EXAM: MRI HEAD WITHOUT CONTRAST TECHNIQUE:  Multiplanar, multiecho pulse sequences of the brain and surrounding structures were obtained without intravenous contrast. COMPARISON:  Head CT without contrast 11/03/2015. FINDINGS: Brain: Restricted diffusion in both PCA territories, greater on the left and with confluent involvement from the mesial left temporal lobe through to the left occipital pole. There also appears to be linear restricted diffusion along the posterior corona radiata bilaterally. There are small foci of restricted diffusion in the thalami more so the right, which corresponds to PCA territory (thalamus striate branches). Scattered small foci of restricted diffusion in both cerebellar hemispheres, more so the right. The brainstem is spared. There is only minimal associated cytotoxic edema at this time. No definite acute hemorrhage. No intracranial mass effect. No midline  shift, mass effect, or evidence of intracranial mass lesion. No ventriculomegaly. Patent basilar cisterns. Negative pituitary and cervicomedullary junction. Cerebral volume is within normal limits for age. There is a chronic lacunar infarct in the left basal ganglia, but no underlying cortical encephalomalacia identified. Mild for age patchy nonspecific white matter changes elsewhere. Vascular: Major intracranial vascular flow voids are preserved. Skull and upper cervical spine: Negative. Visualized bone marrow signal is within normal limits. Sinuses/Orbits: Postoperative changes to the globes. Otherwise negative orbits soft tissues. Mild paranasal sinus mucosal thickening. Other: Grossly negative scalp soft tissues. IMPRESSION: 1. Multifocal acute infarcts in the brain, especially in both PCA territories. Confluent involvement of the left PCA territory. Multifocal thalamic involvement right greater than left also reflects PCA territory. Posterior corona radiata involvement which could be the distal PCA/MCA watershed. Superimposed scattered bilateral cerebellar artery  territories. 2. This infarct pattern is most suggestive of a posterior circulation embolic event. Minimal edema at this time. No associated hemorrhage or mass effect. 3. Mild for age underlying chronic small vessel disease. Electronically Signed   By: Odessa Fleming M.D.   On: 11/03/2015 11:30   US Carotid Bilateral (at [facility] Only)  Result Date: 11/03/2015 CLINICAL DATA:  Stroke EXAM: BILATERAL CAROTID DUPLEX ULTRASOUND TECHNIQUE: Wallace Cullens scale imaging, color Doppler and duplex ultrasound were performed of bilateral carotid and vertebral arteries in the neck. COMPARISON:  None. FINDINGS: Criteria: Quantification of carotid stenosis is based on velocity parameters that correlate the residual internal carotid diameter with NASCET-based stenosis levels, using the diameter of the distal internal carotid lumen as the denominator for stenosis measurement. The following velocity measurements were obtained: RIGHT ICA:  143 cm/sec CCA:  63 cm/sec SYSTOLIC ICA/CCA RATIO:  2.3 DIASTOLIC ICA/CCA RATIO:  3.1 ECA:  209 cm/sec LEFT ICA:  142 cm/sec CCA:  64 cm/sec SYSTOLIC ICA/CCA RATIO:  2.2 DIASTOLIC ICA/CCA RATIO:  1.1 ECA:  153 cm/sec RIGHT CAROTID ARTERY: Scattered calcified plaque in the common carotid. Extensive calcified plaque in the bulb which shadows the lumen of the internal carotid low resistance internal carotid Doppler pattern. RIGHT VERTEBRAL ARTERY:  Antegrade. LEFT CAROTID ARTERY: Extensive calcified plaque in the bulb. This shadows the lumen somewhat. Low resistance internal carotid Doppler pattern. LEFT VERTEBRAL ARTERY:  Antegrade. IMPRESSION: There is extensive calcified plaque in both bulbs somewhat limiting the examination. Based on peak systolic internal carotid velocities, estimated stenosis of the right and left internal carotid arteries is 50-69%. Electronically Signed   By: Jolaine Click M.D.   On: 11/03/2015 12:05   Dg Chest Port 1 View  Result Date: 11/03/2015 CLINICAL DATA:  Mental status change.  Breathing problems. Unresponsive to painful stimuli. EXAM: PORTABLE CHEST 1 VIEW COMPARISON:  None. FINDINGS: Cardiac enlargement. No focal airspace disease or consolidation in the lungs. No blunting of costophrenic angles. No pneumothorax. Calcification of the aorta. Old bilateral rib fractures. IMPRESSION: Prominent cardiac enlargement. No evidence of active pulmonary disease. Electronically Signed   By: Burman Nieves M.D.   On: 11/03/2015 05:22    Scheduled Meds: . aspirin  300 mg Rectal Daily   Or  . aspirin  325 mg Oral Daily  . heparin  5,000 Units Subcutaneous Q8H  . Influenza vac split quadrivalent PF  0.5 mL Intramuscular Tomorrow-1000  . mouth rinse  15 mL Mouth Rinse BID  . pneumococcal 23 valent vaccine  0.5 mL Intramuscular Tomorrow-1000   Continuous Infusions: . sodium chloride 75 mL/hr at 11/04/15 0023  . diltiazem (CARDIZEM) infusion 7.5 mg/hr (  11/03/15 2154)     LOS: 1 day   Ledger Heindl, MD FACP Hospitalist.   If 7PM-7AM, please contact night-coverage [email or web address] Password [email or web address] 11/04/2015, 8:18 AM

## 2015-11-05 ENCOUNTER — Encounter (HOSPITAL_COMMUNITY): Payer: Self-pay | Admitting: *Deleted

## 2015-11-05 ENCOUNTER — Inpatient Hospital Stay (HOSPITAL_COMMUNITY)
Admit: 2015-11-05 | Discharge: 2015-11-05 | Disposition: A | Discharge status: Home / Self Care | Payer: Medicare Other | Attending: Internal Medicine | Admitting: Internal Medicine

## 2015-11-05 DIAGNOSIS — I4891 Unspecified atrial fibrillation: Secondary | ICD-10-CM

## 2015-11-05 DIAGNOSIS — R748 Abnormal levels of other serum enzymes: Secondary | ICD-10-CM

## 2015-11-05 DIAGNOSIS — I639 Cerebral infarction, unspecified: Secondary | ICD-10-CM

## 2015-11-05 LAB — GLUCOSE, CAPILLARY
GLUCOSE-CAPILLARY: 119 mg/dL — AB (ref 65–99)
GLUCOSE-CAPILLARY: 153 mg/dL — AB (ref 65–99)
Glucose-Capillary: 153 mg/dL — ABNORMAL HIGH (ref 65–99)
Glucose-Capillary: 140 mg/dL — ABNORMAL HIGH (ref 65–99)
Glucose-Capillary: 135 mg/dL — ABNORMAL HIGH (ref 65–99)

## 2015-11-05 LAB — HEMOGLOBIN A1C
HEMOGLOBIN A1C: 5.9 % — AB (ref 4.8–5.6)
MEAN PLASMA GLUCOSE: 123 mg/dL

## 2015-11-05 LAB — HEPARIN LEVEL (UNFRACTIONATED): HEPARIN UNFRACTIONATED: 0.77 [IU]/mL — AB (ref 0.30–0.70)

## 2015-11-05 MED ORDER — STROKE: EARLY STAGES OF RECOVERY BOOK
Freq: Once | Status: AC
  Administered 2015-11-05: 1
  Filled 2015-11-05: qty 1

## 2015-11-05 MED ORDER — HEPARIN (PORCINE) IN NACL 100-0.45 UNIT/ML-% IJ SOLN
800.0000 [IU]/h | INTRAMUSCULAR | Status: DC
  Administered 2015-11-05: 900 [IU]/h via INTRAVENOUS
  Filled 2015-11-05: qty 250

## 2015-11-05 MED ORDER — DILTIAZEM HCL 25 MG/5ML IV SOLN
10.0000 mg | Freq: Once | INTRAVENOUS | Status: DC
  Filled 2015-11-05: qty 5

## 2015-11-05 MED ORDER — HEPARIN BOLUS VIA INFUSION
4000.0000 [IU] | Freq: Once | INTRAVENOUS | Status: AC
Start: 2015-11-05 — End: 2015-11-05
  Administered 2015-11-05: 4000 [IU] via INTRAVENOUS
  Filled 2015-11-05: qty 4000

## 2015-11-05 MED ORDER — METOPROLOL TARTRATE 5 MG/5ML IV SOLN
5.0000 mg | Freq: Once | INTRAVENOUS | Status: AC
  Administered 2015-11-05: 5 mg via INTRAVENOUS
  Filled 2015-11-05: qty 5

## 2015-11-05 NOTE — Progress Notes (Signed)
SLP Cancellation Note  Patient Details Name: Tonya Henderson MRN: [id number] DOB: Mar 24, 1919   Cancelled treatment:       Reason Eval/Treat Not Completed: Patient at procedure or test/unavailable; SLP attempted to see pt for speech/language evaluation, however pt was in procedure (EEG). Pt reportedly not as alert today. Pt passed RN swallow screen so SLP following for speech/language evaluation only at this time. If there are swallowing concerns, please order bedside swallow evaluation. SLP will check back for SLE as schedule permits.  Thank you,  Havery MorosDabney Porter, CCC-SLP [phone]    PORTER,DABNEY 11/05/2015, 4:19 PM

## 2015-11-05 NOTE — Consult Note (Signed)
[place] A. Merlene Laughter, MD     [email or web address]          Tonya Henderson is an 82 y.o. female.   ASSESSMENT/PLAN: Severe encephalopathy which I think is most likely coming from bilateral posterior circulation/PCA infarcts. 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 She is likely having symptoms of encephalopathy due to ischemia to brain stem structures. This can be a very serious condition resulting in severe neurological deficits and the high risk of mortality. We could do a brain MRA to confirm the suspicion of vascular insufficiency. This may not ultimately change what we do or her prognosis. We certainly will follow the patient's neurological exam. Further discussions with the family regarding potential withdrawal of care should be undertaken. The overall prognosis is very poor.   The patient 82 year old female who was highly functional at baseline. She stayed by herself most of the day but at night and was stated relatives. She has good cognition and able to ambulate at baseline. The patient presented a few days ago with gait ataxia, slurred speech and does not feeling well. She was obtunded. She improved her cognition in the emergency room but subsequent has become obtunded and unresponsive. Imaging shows bilateral cerebellar and the occipital infarcts. She has remained unresponsive and hence a neurological consultation. No metabolic etiologies have been uncovered. Unable to do review of systems because of impaired cognition.  GENERAL: Unresponsive.  HEENT: Supple. Atraumatic normocephalic.   ABDOMEN: soft  EXTREMITIES: No edema   BACK: Normal.  SKIN: Normal by inspection.    MENTAL STATUS: She does moans and groans. There is no eye opening even to deep painful stimuli.   CRANIAL NERVES: Pupils are equal, round and reactive to light; The eyes are deviated to the left but can be overcome pass midline with  oculocephalic reflexes to about 80%; The patient is noted to have tonic movements of the eyes (which are deviated towards the left) to the midline and then deviates back to the left with a few beats of nystagmus. Vestibular ocular reflex using cold colorics in the right ear was given with the eye deviating towards the right. Upper and lower facial muscles are normal in strength and symmetric, there is no flattening of the nasolabial folds.  MOTOR: The patient withdraws to deep painful stimuli. Complete drift of all 4 extremities.  COORDINATION: Left finger to nose is normal, right finger to nose is normal, No rest tremor; no intention tremor; no postural tremor; no bradykinesia.  REFLEXES: Deep tendon reflexes are symmetrical and normal.   SENSATION: Responsive the pain was similar bilaterally.     Blood pressure (!) 175/121, pulse (!) 124, temperature 98.1 F (36.7 C), temperature source Axillary, resp. rate (!) 24, height 5' 4"  (1.626 m), weight 130 lb 8.2 oz (59.2 kg), SpO2 95 %.  Past Medical History:  Diagnosis Date  . Degenerated eye   . Essential hypertension   . Hyperlipidemia     Past Surgical History:  Procedure Laterality Date  . EYE SURGERY     [facility], [place]  . ORIF WRIST FRACTURE  11/06/2010   Procedure: OPEN REDUCTION INTERNAL FIXATION (ORIF) WRIST FRACTURE;  Surgeon: Arther Abbott, MD;  Location: [facility];  Service: Orthopedics;  Laterality: Right;  With DVR Plates    Family History  Problem Relation Age of Onset  . Heart disease    . Lung disease    . Heart disease Sister   . Heart  disease Brother   . Anesthesia problems Neg Hx   . Hypotension Neg Hx   . Pseudochol deficiency Neg Hx   . Malignant hyperthermia Neg Hx     Social History:  reports that she has never smoked. Her smokeless tobacco use includes Snuff. She reports that she does not drink alcohol or use drugs.  Allergies: No Known Allergies  Medications: Prior to Admission medications     Medication Sig Start Date End Date Taking? Authorizing Provider  amLODipine (NORVASC) 10 MG tablet Take 10 mg by mouth daily.     Yes Historical Provider, MD  aspirin EC 81 MG tablet Take 81 mg by mouth daily.     Yes Historical Provider, MD  cyanocobalamin (,VITAMIN B-12,) 1000 MCG/ML injection Inject 1,000 mcg into the muscle every 30 (thirty) days.   Yes Historical Provider, MD  folic acid (FOLVITE) 1 MG tablet Take 1 mg by mouth daily.     Yes Historical Provider, MD  hydrochlorothiazide (MICROZIDE) 12.5 MG capsule Take 12.5 mg by mouth daily.   Yes Historical Provider, MD  levothyroxine (SYNTHROID, LEVOTHROID) 25 MCG tablet Take 25 mcg by mouth daily before breakfast.   Yes Historical Provider, MD  olmesartan-hydrochlorothiazide (BENICAR HCT) 20-12.5 MG per tablet Take 1 tablet by mouth daily.     Yes Historical Provider, MD  rosuvastatin (CRESTOR) 20 MG tablet Take 20 mg by mouth daily.     Yes Historical Provider, MD    Scheduled Meds: . aspirin  300 mg Rectal Daily   Or  . aspirin  325 mg Oral Daily  . diltiazem  10 mg Intravenous Once  . mouth rinse  15 mL Mouth Rinse BID   Continuous Infusions: . sodium chloride 75 mL/hr at 11/05/15 0159  . diltiazem (CARDIZEM) infusion 15 mg/hr (11/05/15 1338)  . heparin 900 Units/hr (11/05/15 0929)   PRN Meds:.     Results for orders placed or performed during the hospital encounter of 11/03/15 (from the past 48 hour(s))  Glucose, capillary     Status: Abnormal   Collection Time: 11/03/15  8:59 PM  Result Value Ref Range   Glucose-Capillary 119 (H) 65 - 99 mg/dL   Comment 1 Notify RN   Troponin I (q 6hr x 3)     Status: Abnormal   Collection Time: 11/03/15  9:08 PM  Result Value Ref Range   Troponin I 0.12 (HH) <0.03 ng/mL    Comment: CRITICAL VALUE NOTED.  VALUE IS CONSISTENT WITH PREVIOUSLY REPORTED AND CALLED VALUE.  Hemoglobin A1c     Status: Abnormal   Collection Time: 11/04/15  4:04 AM  Result Value Ref Range   Hgb A1c  MFr Bld 5.9 (H) 4.8 - 5.6 %    Comment: (NOTE)         Pre-diabetes: 5.7 - 6.4         Diabetes: >6.4         Glycemic control for adults with diabetes: <7.0    Mean Plasma Glucose 123 mg/dL    Comment: (NOTE) Performed At: [facility] [place], [place] [id number] Lindon Romp MD [id number]   Lipid panel     Status: None   Collection Time: 11/04/15  4:04 AM  Result Value Ref Range   Cholesterol 108 0 - 200 mg/dL   Triglycerides 59 <150 mg/dL   HDL 57 >40 mg/dL   Total CHOL/HDL Ratio 1.9 RATIO   VLDL 12 0 - 40 mg/dL   LDL Cholesterol 39  0 - 99 mg/dL    Comment:        Total Cholesterol/HDL:CHD Risk Coronary Heart Disease Risk Table                     Men   Women  1/2 Average Risk   3.4   3.3  Average Risk       5.0   4.4  2 X Average Risk   9.6   7.1  3 X Average Risk  23.4   11.0        Use the calculated Patient Ratio above and the CHD Risk Table to determine the patient's CHD Risk.        ATP III CLASSIFICATION (LDL):  <100     mg/dL   Optimal  100-129  mg/dL   Near or Above                    Optimal  130-159  mg/dL   Borderline  160-189  mg/dL   High  >190     mg/dL   Very High   Comprehensive metabolic panel     Status: Abnormal   Collection Time: 11/04/15  4:04 AM  Result Value Ref Range   Sodium 136 135 - 145 mmol/L   Potassium 3.4 (L) 3.5 - 5.1 mmol/L   Chloride 108 101 - 111 mmol/L   CO2 23 22 - 32 mmol/L   Glucose, Bld 109 (H) 65 - 99 mg/dL   BUN 32 (H) 6 - 20 mg/dL   Creatinine, Ser 1.23 (H) 0.44 - 1.00 mg/dL   Calcium 9.3 8.9 - 10.3 mg/dL   Total Protein 5.9 (L) 6.5 - 8.1 g/dL   Albumin 3.0 (L) 3.5 - 5.0 g/dL   AST 17 15 - 41 U/L   ALT 10 (L) 14 - 54 U/L   Alkaline Phosphatase 54 38 - 126 U/L   Total Bilirubin 0.6 0.3 - 1.2 mg/dL   GFR calc non Af Amer 36 (L) >60 mL/min   GFR calc Af Amer 42 (L) >60 mL/min    Comment: (NOTE) The eGFR has been calculated using the CKD EPI equation. This calculation has not been  validated in all clinical situations. eGFR's persistently <60 mL/min signify possible Chronic Kidney Disease.    Anion gap 5 5 - 15  CBC     Status: Abnormal   Collection Time: 11/04/15  4:04 AM  Result Value Ref Range   WBC 9.4 4.0 - 10.5 K/uL   RBC 3.44 (L) 3.87 - 5.11 MIL/uL   Hemoglobin 9.9 (L) 12.0 - 15.0 g/dL   HCT 30.4 (L) 36.0 - 46.0 %   MCV 88.4 78.0 - 100.0 fL   MCH 28.8 26.0 - 34.0 pg   MCHC 32.6 30.0 - 36.0 g/dL   RDW 14.0 11.5 - 15.5 %   Platelets 213 150 - 400 K/uL  Glucose, capillary     Status: Abnormal   Collection Time: 11/04/15  7:46 AM  Result Value Ref Range   Glucose-Capillary 126 (H) 65 - 99 mg/dL  Glucose, capillary     Status: Abnormal   Collection Time: 11/04/15 11:44 AM  Result Value Ref Range   Glucose-Capillary 148 (H) 65 - 99 mg/dL  Glucose, capillary     Status: Abnormal   Collection Time: 11/04/15  3:57 PM  Result Value Ref Range   Glucose-Capillary 128 (H) 65 - 99 mg/dL  Glucose, capillary     Status: Abnormal  Collection Time: 11/04/15  8:59 PM  Result Value Ref Range   Glucose-Capillary 138 (H) 65 - 99 mg/dL   Comment 1 Notify RN   Glucose, capillary     Status: Abnormal   Collection Time: 11/05/15  7:48 AM  Result Value Ref Range   Glucose-Capillary 153 (H) 65 - 99 mg/dL   Comment 1 Notify RN    Comment 2 Document in Chart   Glucose, capillary     Status: Abnormal   Collection Time: 11/05/15 11:42 AM  Result Value Ref Range   Glucose-Capillary 140 (H) 65 - 99 mg/dL   Comment 1 Notify RN    Comment 2 Document in Chart   Glucose, capillary     Status: Abnormal   Collection Time: 11/05/15  4:44 PM  Result Value Ref Range   Glucose-Capillary 135 (H) 65 - 99 mg/dL   Comment 1 Notify RN    Comment 2 Document in Chart     Studies/Results:  BRAIN MRI Brain: Restricted diffusion in both PCA territories, greater on the left and with confluent involvement from the mesial left temporal lobe through to the left occipital pole. There  also appears to be linear restricted diffusion along the posterior corona radiata bilaterally. There are small foci of restricted diffusion in the thalami more so the right, which corresponds to PCA territory (thalamus striate branches). Scattered small foci of restricted diffusion in both cerebellar hemispheres, more so the right.  The brainstem is spared. There is only minimal associated cytotoxic edema at this time. No definite acute hemorrhage. No intracranial mass effect.  No midline shift, mass effect, or evidence of intracranial mass lesion. No ventriculomegaly. Patent basilar cisterns. Negative pituitary and cervicomedullary junction. Cerebral volume is within normal limits for age. There is a chronic lacunar infarct in the left basal ganglia, but no underlying cortical encephalomalacia identified. Mild for age patchy nonspecific white matter changes elsewhere.  Vascular: Major intracranial vascular flow voids are preserved.  Skull and upper cervical spine: Negative. Visualized bone marrow signal is within normal limits.  Sinuses/Orbits: Postoperative changes to the globes. Otherwise negative orbits soft tissues. Mild paranasal sinus mucosal thickening.  Other: Grossly negative scalp soft tissues.  IMPRESSION: 1. Multifocal acute infarcts in the brain, especially in both PCA territories. Confluent involvement of the left PCA territory. Multifocal thalamic involvement right greater than left also reflects PCA territory. Posterior corona radiata involvement which could be the distal PCA/MCA watershed. Superimposed scattered bilateral cerebellar artery territories. 2. This infarct pattern is most suggestive of a posterior circulation embolic event. Minimal edema at this time. No associated hemorrhage or mass effect. 3. Mild for age underlying chronic small vessel disease.      CAROTID DOPPLERS IMPRESSION: There is extensive calcified plaque in both bulbs  somewhat limiting the examination. Based on peak systolic internal carotid velocities, estimated stenosis of the right and left internal carotid arteries is 50-69%.  (ICA SYSTOLIC VEL -- R 016   L 142)    TTE - Moderate LVH with LVEF 60-65%. Indeterminate diastolic function   in the setting of atrial fibrillation. Severe left atrial   enlargement. Mild mitral regurgitation. Mildly sclerotic aortic   valve with moderate nodular calcination of the left coronary   cusp. Mild tricuspid regurgitation with PASP estimated 50 mmHg.   Mild right atrial enlargement. There is evidence of PFO based on   color Doppler interrogation. Small to moderate sized pericardial   effusion noted with evidence of organization anterior to the  right ventricle.         Kordel Leavy A. Merlene Laughter, M.D.  Diplomate, Tax adviser of Psychiatry and Neurology ( Neurology). 11/05/2015, 5:20 PM

## 2015-11-05 NOTE — Progress Notes (Signed)
EEG Completed; Results Pending  

## 2015-11-05 NOTE — Procedures (Signed)
  [facility] Azi[place] Arena A. Gerilyn Pilgrimoonquah, MD     [email or web address]           HISTORY: The patient is [age] and presented on the mental status. The study be done to evaluate for seizures of the cause of the encephalopathy.  MEDICATIONS: Scheduled Meds: . aspirin  300 mg Rectal Daily   Or  . aspirin  325 mg Oral Daily  . diltiazem  10 mg Intravenous Once  . mouth rinse  15 mL Mouth Rinse BID   Continuous Infusions: . sodium chloride 75 mL/hr at 11/05/15 0159  . diltiazem (CARDIZEM) infusion 15 mg/hr (11/05/15 1953)  . heparin 800 Units/hr (11/05/15 1928)   PRN Meds:.  Prior to Admission medications   Medication Sig Start Date End Date Taking? Authorizing Provider  amLODipine (NORVASC) 10 MG tablet Take 10 mg by mouth daily.     Yes Historical Provider, MD  aspirin EC 81 MG tablet Take 81 mg by mouth daily.     Yes Historical Provider, MD  cyanocobalamin (,VITAMIN B-12,) 1000 MCG/ML injection Inject 1,000 mcg into the muscle every 30 (thirty) days.   Yes Historical Provider, MD  folic acid (FOLVITE) 1 MG tablet Take 1 mg by mouth daily.     Yes Historical Provider, MD  hydrochlorothiazide (MICROZIDE) 12.5 MG capsule Take 12.5 mg by mouth daily.   Yes Historical Provider, MD  levothyroxine (SYNTHROID, LEVOTHROID) 25 MCG tablet Take 25 mcg by mouth daily before breakfast.   Yes Historical Provider, MD  olmesartan-hydrochlorothiazide (BENICAR HCT) 20-12.5 MG per tablet Take 1 tablet by mouth daily.     Yes Historical Provider, MD  rosuvastatin (CRESTOR) 20 MG tablet Take 20 mg by mouth daily.     Yes Historical Provider, MD      ANALYSIS: A 16 channel recording using standard 10 20 measurements is conducted for 22 minutes. The background activity gets as high as 4-5 Hz. There are occasional triphasic waves. There are frontal beta activities observed from time to time. No focal or lateral slowing is observed. Photic simulation and hyperventilation are not conducted. There is no  epileptiform activity is observed.   IMPRESSION:  This recording show moderately severe global slowing. There is also triphasic waves. Both of these conditions are associated with global encephalopathies particularly from metabolic causes but can be seen with that encephalopathies of other causes.      Kurt Azimi A. Gerilyn Pilgrimoonquah, M.D.  Diplomate, Biomedical engineerAmerican Board of Psychiatry and Neurology ( Neurology).

## 2015-11-05 NOTE — Progress Notes (Signed)
PROGRESS NOTE    Rondall AllegraMyrtle S Erber  VWU:[id number] DOB: [birth year] DOA: 11/03/2015 PCP: Pearson GrippeJames Kim, MD    Brief Narrative: 82 yo admitted with newly diagnosed afib and RVR, acute posterior CVA, presented as confusion and agitation.  MRI showed multi-infarcts posterior circulation.  ECHO OK, carotid with plaques and 50-60 percent stenosis.  Lipid is excellent on no meds. She is still intermittently agitated and lethargic.  She was able to swallow but is too sleepy to take orally.  She was able to walk to the BR 2 days ago. No sedative meds given.    Assessment & Plan:   Principal Problem:   Acute CVA (cerebrovascular accident) (HCC) Active Problems:   Atrial fibrillation with RVR (HCC)   HTN (hypertension)   History of congestive heart failure   Elevated troponin  1. Acute CVA:  Will continue with current Tx.  She will need to be on anticoagulation. Will start IV heparin today.  She is 72 hours post CVA now.   Will see when it is safe for her to take oral meds. Will obtain EEG as she is not waking up more.  Will await consultation from  neurology as well. 2. Afib with RVR (CHADS 5):  Continue with IV Cardiazem today. Start IV Heparin for now pending  MRI did not show large CVA and minimal edema.  3. HTN:  Will allow permissive HTN.   4. Elevated troponin:  Demand ischemia.  No further work up is planned.  DVT prophylaxis: IV Heparin.  Code Status:  FULL CODE.  Family Communication: daughter today.  Disposition Plan: home per family when time for discharge.   Consultants:   Awaiting neurology and cardiology.   Procedures:   ECHO, MRI, carotid. pending EEG.   Antimicrobials: Anti-infectives    None       Subjective:  Not responsive.    Objective: Vitals:   11/05/15 0721 11/05/15 0722 11/05/15 0800 11/05/15 0804  BP:   (!) 153/48   Pulse: (!) 110 (!) 106 96   Resp: (!) 24 (!) 24 (!) 23   Temp:    98.7 F (37.1 C)  TempSrc:    Axillary  SpO2: 93% 94% 95%   Weight:       Height:        Intake/Output Summary (Last 24 hours) at 11/05/15 0838 Last data filed at 11/05/15 0500  Gross per 24 hour  Intake           829.77 ml  Output             1900 ml  Net         -1070.23 ml   Filed Weights   11/03/15 0444 11/04/15 0500 11/05/15 0500  Weight: 64.4 kg (142 lb) 61.5 kg (135 lb 9.3 oz) 59.2 kg (130 lb 8.2 oz)    Examination:  General exam: Appears calm and comfortable  Respiratory system: Clear to auscultation. Respiratory effort normal. Cardiovascular system: S1 & S2 heard, RRR. No JVD, murmurs, rubs, gallops or clicks. No pedal edema. Gastrointestinal system: Abdomen is nondistended, soft and nontender. No organomegaly or masses felt. Normal bowel sounds heard. Central nervous system: Alert and oriented. No focal neurological deficits. Extremities: Symmetric 5 x 5 power. Skin: No rashes, lesions or ulcers Psychiatry: Judgement and insight appear normal. Mood & affect appropriate.   Data Reviewed: I have personally reviewed following labs and imaging studies  CBC:  Recent Labs Lab 11/03/15 0443 11/04/15 0404  WBC 8.1 9.4  NEUTROABS  3.4  --   HGB 10.4* 9.9*  HCT 31.9* 30.4*  MCV 88.6 88.4  PLT 203 213   Basic Metabolic Panel:  Recent Labs Lab 11/03/15 0443 11/04/15 0404  NA 135 136  K 3.2* 3.4*  CL 108 108  CO2 21* 23  GLUCOSE 172* 109*  BUN 43* 32*  CREATININE 1.63* 1.23*  CALCIUM 9.5 9.3    Liver Function Tests:  Recent Labs Lab 11/03/15 0443 11/04/15 0404  AST 14* 17  ALT 10* 10*  ALKPHOS 57 54  BILITOT 0.6 0.6  PROT 6.5 5.9*  ALBUMIN 3.3* 3.0*   Coagulation Profile:  Recent Labs Lab 11/03/15 0443  INR 1.07   Cardiac Enzymes:  Recent Labs Lab 11/03/15 0443 11/03/15 0823 11/03/15 1507 11/03/15 2108  TROPONINI 0.12* 0.11* 0.11* 0.12*   HbA1C:  Recent Labs  11/04/15 0404  HGBA1C 5.9*   CBG:  Recent Labs Lab 11/04/15 0746 11/04/15 1144 11/04/15 1557 11/04/15 2059 11/05/15 0748  GLUCAP  126* 148* 128* 138* 153*   Lipid Profile:  Recent Labs  11/04/15 0404  CHOL 108  HDL 57  LDLCALC 39  TRIG 59  CHOLHDL 1.9   Sepsis Labs:  Recent Labs Lab 11/03/15 0457  LATICACIDVEN 1.54    Recent Results (from the past 240 hour(s))  MRSA PCR Screening     Status: None   Collection Time: 11/03/15  8:22 AM  Result Value Ref Range Status   MRSA by PCR NEGATIVE NEGATIVE Final    Comment:        The GeneXpert MRSA Assay (FDA approved for NASAL specimens only), is one component of a comprehensive MRSA colonization surveillance program. It is not intended to diagnose MRSA infection nor to guide or monitor treatment for MRSA infections.      Radiology Studies: Mr Brain 95 Contrast  Result Date: 11/03/2015 CLINICAL DATA:  81 year old female found unresponsive. Initial encounter. EXAM: MRI HEAD WITHOUT CONTRAST TECHNIQUE: Multiplanar, multiecho pulse sequences of the brain and surrounding structures were obtained without intravenous contrast. COMPARISON:  Head CT without contrast 11/03/2015. FINDINGS: Brain: Restricted diffusion in both PCA territories, greater on the left and with confluent involvement from the mesial left temporal lobe through to the left occipital pole. There also appears to be linear restricted diffusion along the posterior corona radiata bilaterally. There are small foci of restricted diffusion in the thalami more so the right, which corresponds to PCA territory (thalamus striate branches). Scattered small foci of restricted diffusion in both cerebellar hemispheres, more so the right. The brainstem is spared. There is only minimal associated cytotoxic edema at this time. No definite acute hemorrhage. No intracranial mass effect. No midline shift, mass effect, or evidence of intracranial mass lesion. No ventriculomegaly. Patent basilar cisterns. Negative pituitary and cervicomedullary junction. Cerebral volume is within normal limits for age. There is a chronic  lacunar infarct in the left basal ganglia, but no underlying cortical encephalomalacia identified. Mild for age patchy nonspecific white matter changes elsewhere. Vascular: Major intracranial vascular flow voids are preserved. Skull and upper cervical spine: Negative. Visualized bone marrow signal is within normal limits. Sinuses/Orbits: Postoperative changes to the globes. Otherwise negative orbits soft tissues. Mild paranasal sinus mucosal thickening. Other: Grossly negative scalp soft tissues. IMPRESSION: 1. Multifocal acute infarcts in the brain, especially in both PCA territories. Confluent involvement of the left PCA territory. Multifocal thalamic involvement right greater than left also reflects PCA territory. Posterior corona radiata involvement which could be the distal PCA/MCA watershed. Superimposed scattered bilateral  cerebellar artery territories. 2. This infarct pattern is most suggestive of a posterior circulation embolic event. Minimal edema at this time. No associated hemorrhage or mass effect. 3. Mild for age underlying chronic small vessel disease. Electronically Signed   By: Odessa Fleming M.D.   On: 11/03/2015 11:30   US Carotid Bilateral (at [facility] Only)  Result Date: 11/03/2015 CLINICAL DATA:  Stroke EXAM: BILATERAL CAROTID DUPLEX ULTRASOUND TECHNIQUE: Wallace Cullens scale imaging, color Doppler and duplex ultrasound were performed of bilateral carotid and vertebral arteries in the neck. COMPARISON:  None. FINDINGS: Criteria: Quantification of carotid stenosis is based on velocity parameters that correlate the residual internal carotid diameter with NASCET-based stenosis levels, using the diameter of the distal internal carotid lumen as the denominator for stenosis measurement. The following velocity measurements were obtained: RIGHT ICA:  143 cm/sec CCA:  63 cm/sec SYSTOLIC ICA/CCA RATIO:  2.3 DIASTOLIC ICA/CCA RATIO:  3.1 ECA:  209 cm/sec LEFT ICA:  142 cm/sec CCA:  64 cm/sec SYSTOLIC ICA/CCA RATIO:   2.2 DIASTOLIC ICA/CCA RATIO:  1.1 ECA:  153 cm/sec RIGHT CAROTID ARTERY: Scattered calcified plaque in the common carotid. Extensive calcified plaque in the bulb which shadows the lumen of the internal carotid low resistance internal carotid Doppler pattern. RIGHT VERTEBRAL ARTERY:  Antegrade. LEFT CAROTID ARTERY: Extensive calcified plaque in the bulb. This shadows the lumen somewhat. Low resistance internal carotid Doppler pattern. LEFT VERTEBRAL ARTERY:  Antegrade. IMPRESSION: There is extensive calcified plaque in both bulbs somewhat limiting the examination. Based on peak systolic internal carotid velocities, estimated stenosis of the right and left internal carotid arteries is 50-69%. Electronically Signed   By: Jolaine Click M.D.   On: 11/03/2015 12:05    Scheduled Meds: . aspirin  300 mg Rectal Daily   Or  . aspirin  325 mg Oral Daily  . heparin  5,000 Units Subcutaneous Q8H  . mouth rinse  15 mL Mouth Rinse BID   Continuous Infusions: . sodium chloride 75 mL/hr at 11/05/15 0159  . diltiazem (CARDIZEM) infusion 10 mg/hr (11/05/15 0659)     LOS: 2 days   Ringo Sherod, MD FACP Hospitalist.  If 7PM-7AM, please contact night-coverage [email or web address] Password [facility] 11/05/2015, 8:38 AM

## 2015-11-05 NOTE — Consult Note (Addendum)
Requesting provider: Dr. Marguerite OleaPeter Henderson Consulting cardiologist: Dr. Jonelle SidleSamuel G. Ulysses Henderson  Reason for consultation: Atrial fibrillation  Clinical Summary [name] is a 82 y.o.female currently admitted to the hospital with acute stroke, multifocal acute infarction noted by brain MRI particularly in both PCA distributions. She was also found to be in rapid atrial fibrillation, onset uncertain.   Echocardiography demonstrates LVEF 60-65% with evidence of PFO based on color Doppler interrogation and a small to moderate sized pericardial effusion. She is lethargic at this time, already failed a swallowing evaluation and is currently not taking any oral medications. Neurology consultation is pending. At this point she is on IV heparin and diltiazem, she remains in atrial fibrillation.  By report patient was apparently functional and independent prior to this event.  No Known Allergies  Medications Scheduled Medications: . aspirin  300 mg Rectal Daily   Or  . aspirin  325 mg Oral Daily  . heparin  5,000 Units Subcutaneous Q8H  . mouth rinse  15 mL Mouth Rinse BID    Infusions: . sodium chloride 75 mL/hr at 11/05/15 0159  . diltiazem (CARDIZEM) infusion 12.5 mg/hr (11/05/15 0800)  . heparin 900 Units/hr (11/05/15 16100929)     Past Medical History:  Diagnosis Date  . Degenerated eye   . Essential hypertension   . Hyperlipidemia     Past Surgical History:  Procedure Laterality Date  . EYE SURGERY     [facility], [place]  . ORIF WRIST FRACTURE  11/06/2010   Procedure: OPEN REDUCTION INTERNAL FIXATION (ORIF) WRIST FRACTURE;  Surgeon: Fuller CanadaStanley Harrison, MD;  Location: [facility];  Service: Orthopedics;  Laterality: Right;  With DVR Plates    Family History  Problem Relation Age of Onset  . Heart disease    . Lung disease    . Heart disease Sister   . Heart disease Brother   . Anesthesia problems Neg Hx   . Hypotension Neg Hx   . Pseudochol deficiency Neg Hx   . Malignant hyperthermia Neg Hx      Social History [name] reports that she has never smoked. Her smokeless tobacco use includes Snuff. [name] reports that she does not drink alcohol.  Review of Systems Complete review of systems was not able to be obtained due to altered mental status.  Physical Examination Blood pressure (!) 179/73, pulse (!) 118, temperature 98.7 F (37.1 C), temperature source Axillary, resp. rate (!) 25, height 5\' 4"  (1.626 m), weight 130 lb 8.2 oz (59.2 kg), SpO2 93 %.  Intake/Output Summary (Last 24 hours) at 11/05/15 1008 Last data filed at 11/05/15 0500  Gross per 24 hour  Intake           764.27 ml  Output             1900 ml  Net         -1135.73 ml   Telemetry: Atrial fibrillation.  Interval: Elderly woman, lethargic and not communicating. HEENT: Conjunctiva and lids normal, oropharynx clear. Neck: Supple, no elevated JVP or carotid bruits, no thyromegaly. Lungs: Clear to auscultation, nonlabored breathing at rest. Cardiac: Irregularly irregular, no S3, soft systolic murmur, no pericardial rub. Abdomen: Soft, nontender, bowel sounds present, no guarding or rebound. Extremities: No pitting edema, distal pulses 2+. Protective mits on hands. Skin: Warm and dry. Musculoskeletal: No kyphosis. Neuropsychiatric: Lethargic.  Lab Results  Basic Metabolic Panel:  Recent Labs Lab 11/03/15 0443 11/04/15 0404  NA 135 136  K 3.2* 3.4*  CL 108  108  CO2 21* 23  GLUCOSE 172* 109*  BUN 43* 32*  CREATININE 1.63* 1.23*  CALCIUM 9.5 9.3    Liver Function Tests:  Recent Labs Lab 11/03/15 0443 11/04/15 0404  AST 14* 17  ALT 10* 10*  ALKPHOS 57 54  BILITOT 0.6 0.6  PROT 6.5 5.9*  ALBUMIN 3.3* 3.0*    CBC:  Recent Labs Lab 11/03/15 0443 11/04/15 0404  WBC 8.1 9.4  NEUTROABS 3.4  --   HGB 10.4* 9.9*  HCT 31.9* 30.4*  MCV 88.6 88.4  PLT 203 213    Cardiac Enzymes:  Recent Labs Lab 11/03/15 0443 11/03/15 0823 11/03/15 1507 11/03/15 2108  TROPONINI 0.12*  0.11* 0.11* 0.12*    ECG Tracing from 11/03/2015 showed atrial fibrillation at 117 bpm with nonspecific ST-T changes.  Imaging Carotid Dopplers 11/03/2015: IMPRESSION: There is extensive calcified plaque in both bulbs somewhat limiting the examination. Based on peak systolic internal carotid velocities, estimated stenosis of the right and left internal carotid arteries is 50-69%.  Brain MRI 11/03/2015: IMPRESSION: 1. Multifocal acute infarcts in the brain, especially in both PCA territories. Confluent involvement of the left PCA territory. Multifocal thalamic involvement right greater than left also reflects PCA territory. Posterior corona radiata involvement which could be the distal PCA/MCA watershed. Superimposed scattered bilateral cerebellar artery territories. 2. This infarct pattern is most suggestive of a posterior circulation embolic event. Minimal edema at this time. No associated hemorrhage or mass effect. 3. Mild for age underlying chronic small vessel disease.  Impression  1. Newly documented atrial fibrillation in the setting of acute stroke. Her CHADSVASC score is 5. At present she is on aspirin and IV heparin, lethargic and unable to take oral medications.  2. Multifocal acute brain infarcts, particularly in both PCA territories as outlined by brain MRI. Patient is lethargic with worsening mental status over the last 48 hours. Neurology consultation pending. Overall prognosis seems poor.  3. History of hypertension.  4. Hyperlipidemia.  5. Bilateral carotid artery disease, 50-69% stenoses by recent Dopplers.  6. PFO by color Doppler interrogation per echocardiography.  7. Mild troponin I elevation in flat pattern, not consistent with ACS.  Recommendations  Continue IV heparin for now and IV diltiazem for heart rate control. Conversion to an oral DOAC can ultimately be considered if mental status improves and she is able to take oral medications. PFO could be  a contributor, although would be managed medically at this point. Overall prognosis seems poor.  Tonya Sidle, M.D., F.A.C.C.

## 2015-11-05 NOTE — Progress Notes (Addendum)
ANTICOAGULATION CONSULT NOTE - Initial Consult  Pharmacy Consult for Heparin Indication: atrial fibrillation  No Known Allergies  Patient Measurements: Height: 5\' 4"  (162.6 cm) Weight: 130 lb 8.2 oz (59.2 kg) IBW/kg (Calculated) : 54.7 HEPARIN DW (KG): 64.4  Vital Signs: Temp: 98.7 F (37.1 C) (10/02 0804) Temp Source: Axillary (10/02 0804) BP: 179/73 (10/02 0845) Pulse Rate: 118 (10/02 0845)  Labs:  Recent Labs  11/03/15 0443 11/03/15 0823 11/03/15 1507 11/03/15 2108 11/04/15 0404  HGB 10.4*  --   --   --  9.9*  HCT 31.9*  --   --   --  30.4*  PLT 203  --   --   --  213  APTT 27  --   --   --   --   LABPROT 13.9  --   --   --   --   INR 1.07  --   --   --   --   CREATININE 1.63*  --   --   --  1.23*  TROPONINI 0.12* 0.11* 0.11* 0.12*  --     Estimated Creatinine Clearance: 24.2 mL/min (by C-G formula based on SCr of 1.23 mg/dL (H)).   Medical History: Past Medical History:  Diagnosis Date  . CHF (congestive heart failure) (HCC)   . Degenerated eye   . Enlarged heart   . High cholesterol   . Hypertension     Medications:  Prescriptions Prior to Admission  Medication Sig Dispense Refill Last Dose  . amLODipine (NORVASC) 10 MG tablet Take 10 mg by mouth daily.     11/02/2015 at Unknown time  . aspirin EC 81 MG tablet Take 81 mg by mouth daily.     11/02/2015 at Unknown time  . cyanocobalamin (,VITAMIN B-12,) 1000 MCG/ML injection Inject 1,000 mcg into the muscle every 30 (thirty) days.   Past Month at Unknown time  . folic acid (FOLVITE) 1 MG tablet Take 1 mg by mouth daily.     11/02/2015 at Unknown time  . hydrochlorothiazide (MICROZIDE) 12.5 MG capsule Take 12.5 mg by mouth daily.   11/02/2015 at Unknown time  . levothyroxine (SYNTHROID, LEVOTHROID) 25 MCG tablet Take 25 mcg by mouth daily before breakfast.   11/02/2015 at Unknown time  . olmesartan-hydrochlorothiazide (BENICAR HCT) 20-12.5 MG per tablet Take 1 tablet by mouth daily.     11/02/2015 at Unknown  time  . rosuvastatin (CRESTOR) 20 MG tablet Take 20 mg by mouth daily.     11/02/2015 at Unknown time    Assessment: 82 yo admitted with newly diagnosed afib and RVR, acute posterior CVA, presented as confusion and agitation. To start IV heparin for anticoagulation.  Goal of Therapy:  Heparin level 0.3-0.7 units/ml Monitor platelets by anticoagulation protocol: Yes   Plan:  Give 4000 units bolus x 1 Start heparin infusion at 900 units/hr Check anti-Xa level in 8 hours and daily while on heparin Continue to monitor H&H and platelets  Elder CyphersLorie Poole, BS Pharm D, BCPS Clinical Pharmacist Pager [phone] 11/05/2015,8:58 AM  Addum:  Heparin level is supratherapeutic at 0.77 units/ml  Will decrease drip at 800 units/hr and f/u am labs Talbert CageLora Paxtyn Boyar, PharmD

## 2015-11-06 DIAGNOSIS — G9340 Encephalopathy, unspecified: Secondary | ICD-10-CM

## 2015-11-06 LAB — CBC
HCT: 30.5 % — ABNORMAL LOW (ref 36.0–46.0)
HEMOGLOBIN: 9.9 g/dL — AB (ref 12.0–15.0)
MCH: 29 pg (ref 26.0–34.0)
MCHC: 32.5 g/dL (ref 30.0–36.0)
MCV: 89.4 fL (ref 78.0–100.0)
Platelets: 225 10*3/uL (ref 150–400)
RBC: 3.41 MIL/uL — AB (ref 3.87–5.11)
RDW: 14.1 % (ref 11.5–15.5)
WBC: 16.7 10*3/uL — ABNORMAL HIGH (ref 4.0–10.5)

## 2015-11-06 LAB — COMPREHENSIVE METABOLIC PANEL
ALBUMIN: 2.8 g/dL — AB (ref 3.5–5.0)
ALT: 13 U/L — ABNORMAL LOW (ref 14–54)
ANION GAP: 6 (ref 5–15)
AST: 20 U/L (ref 15–41)
Alkaline Phosphatase: 50 U/L (ref 38–126)
BILIRUBIN TOTAL: 1 mg/dL (ref 0.3–1.2)
BUN: 34 mg/dL — AB (ref 6–20)
CALCIUM: 9.4 mg/dL (ref 8.9–10.3)
CHLORIDE: 112 mmol/L — AB (ref 101–111)
CO2: 21 mmol/L — ABNORMAL LOW (ref 22–32)
Creatinine, Ser: 1.25 mg/dL — ABNORMAL HIGH (ref 0.44–1.00)
GFR calc Af Amer: 41 mL/min — ABNORMAL LOW (ref >60)
GFR, EST NON AFRICAN AMERICAN: 36 mL/min — AB (ref >60)
Glucose, Bld: 164 mg/dL — ABNORMAL HIGH (ref 65–99)
POTASSIUM: 3.4 mmol/L — AB (ref 3.5–5.1)
Sodium: 139 mmol/L (ref 135–145)
TOTAL PROTEIN: 5.9 g/dL — AB (ref 6.5–8.1)

## 2015-11-06 LAB — GLUCOSE, CAPILLARY
GLUCOSE-CAPILLARY: 135 mg/dL — AB (ref 65–99)
GLUCOSE-CAPILLARY: 139 mg/dL — AB (ref 65–99)
Glucose-Capillary: 133 mg/dL — ABNORMAL HIGH (ref 65–99)
Glucose-Capillary: 137 mg/dL — ABNORMAL HIGH (ref 65–99)

## 2015-11-06 LAB — HEPARIN LEVEL (UNFRACTIONATED): HEPARIN UNFRACTIONATED: 0.32 [IU]/mL (ref 0.30–0.70)

## 2015-11-06 MED ORDER — ENOXAPARIN SODIUM 60 MG/0.6ML ~~LOC~~ SOLN: 60.0000 mg | SUBCUTANEOUS | Status: DC

## 2015-11-06 MED ORDER — METOPROLOL TARTRATE 5 MG/5ML IV SOLN
5.0000 mg | INTRAVENOUS | Status: DC | PRN
  Administered 2015-11-06 – 2015-11-07 (×2): 5 mg via INTRAVENOUS
  Filled 2015-11-06 (×2): qty 5

## 2015-11-06 NOTE — Progress Notes (Signed)
Consulting cardiologist: Dr. Jonelle Sidle  Seen for followup: Atrial fibrillation  Subjective:    Patient is noncommunicative today. She has had generally worsening encephalopathy since recent stroke.  Objective:   Temp:  [97.6 F (36.4 C)-98.6 F (37 C)] 97.8 F (36.6 C) (10/03 0821) Pulse Rate:  [64-160] 95 (10/03 0800) Resp:  [19-28] 22 (10/03 0800) BP: (135-180)/(35-121) 169/66 (10/03 0800) SpO2:  [89 %-98 %] 91 % (10/03 0800) Weight:  [132 lb 15 oz (60.3 kg)] 132 lb 15 oz (60.3 kg) (10/03 0440) Last BM Date: 11/03/15  Filed Weights   11/04/15 0500 11/05/15 0500 11/06/15 0440  Weight: 135 lb 9.3 oz (61.5 kg) 130 lb 8.2 oz (59.2 kg) 132 lb 15 oz (60.3 kg)    Intake/Output Summary (Last 24 hours) at 11/06/15 0856 Last data filed at 11/06/15 0600  Gross per 24 hour  Intake          2997.78 ml  Output              800 ml  Net          2197.78 ml    Telemetry: Atrial fibrillation.  Exam:  General: Elderly woman, no obvious distress.  Lungs: Coarse breath sounds.  Cardiac: Irregularly irregular.  Extremities: Noncommunicative, encephalopathic.  Lab Results:  Basic Metabolic Panel:  Recent Labs Lab 11/03/15 0443 11/04/15 0404 11/06/15 0420  NA 135 136 139  K 3.2* 3.4* 3.4*  CL 108 108 112*  CO2 21* 23 21*  GLUCOSE 172* 109* 164*  BUN 43* 32* 34*  CREATININE 1.63* 1.23* 1.25*  CALCIUM 9.5 9.3 9.4    CBC:  Recent Labs Lab 11/03/15 0443 11/04/15 0404 11/06/15 0420  WBC 8.1 9.4 16.7*  HGB 10.4* 9.9* 9.9*  HCT 31.9* 30.4* 30.5*  MCV 88.6 88.4 89.4  PLT 203 213 225    Cardiac Enzymes:  Recent Labs Lab 11/03/15 0823 11/03/15 1507 11/03/15 2108  TROPONINI 0.11* 0.11* 0.12*    Echocardiogram 11/03/2015: Study Conclusions  - Left ventricle: The cavity size was normal. Wall thickness was   increased in a pattern of moderate LVH. Systolic function was   normal. The estimated ejection fraction was in the range of 60%   to 65%.  Wall motion was normal; there were no regional wall   motion abnormalities. The study is not technically sufficient to   allow evaluation of LV diastolic function. - Aortic valve: Mildly calcified annulus. Trileaflet; mildly   calcified leaflets. Moderate calcification and nodularity   involving the left coronary cusp. Mean gradient (S): 5 mm Hg.   Valve area (VTI): 2.24 cm^2. - Mitral valve: There was mild regurgitation. - Left atrium: The atrium was severely dilated. - Right atrium: The atrium was mildly dilated. Central venous   pressure (est): 8 mm Hg. - Atrial septum: There was a patent foramen ovale present based on   color Doppler evaluation. - Tricuspid valve: There was mild regurgitation. - Pulmonary arteries: Systolic pressure was moderately increased.   PA peak pressure: 50 mm Hg (S). - Pericardium, extracardiac: A small to moderate pericardial   effusion was identified circumferential to the heart. There is   evidence of organization noted anterior to the right ventricle.  Impressions:  - Moderate LVH with LVEF 60-65%. Indeterminate diastolic function   in the setting of atrial fibrillation. Severe left atrial   enlargement. Mild mitral regurgitation. Mildly sclerotic aortic   valve with moderate nodular calcination of the left coronary   cusp.  Mild tricuspid regurgitation with PASP estimated 50 mmHg.   Mild right atrial enlargement. There is evidence of PFO based on   color Doppler interrogation. Small to moderate sized pericardial   effusion noted with evidence of organization anterior to the   right ventricle.   Medications:   Scheduled Medications: . aspirin  300 mg Rectal Daily   Or  . aspirin  325 mg Oral Daily  . diltiazem  10 mg Intravenous Once  . mouth rinse  15 mL Mouth Rinse BID     Infusions: . sodium chloride 75 mL/hr at 11/06/15 0600  . diltiazem (CARDIZEM) infusion Stopped (11/06/15 0600)  . heparin 800 Units/hr (11/06/15 0600)       Assessment:   1. Newly documented atrial fibrillation in the setting of acute stroke. CHADSVASC score is 5. Heart rate control has been adequate on intravenous diltiazem which was recently stopped. She continues on IV heparin. Unable to take oral agents now with encephalopathy.  2. Mild troponin I elevation in flat pattern, not suggestive of ACS. LVEF 60-65% by echocardiogram without wall motion abnormalities.  3. Moderate bilateral carotid artery disease, 50-69% stenoses by recent Dopplers.  4. PFO by color Doppler imaging.  5. Multifocal acute brain infarcts, particularly in both PCA territories as outlined by brain MRI. Patient has had progressive encephalopathy. Prognosis is poor.   Plan/Discussion:    Continue IV heparin. If further heart rate-control of atrial fibrillation is needed, could consider PRN or standing use of IV Lopressor. She is not able to take oral agents at this point.   Jonelle SidleSamuel G. Brandn Mcgath, M.D., F.A.C.C.

## 2015-11-06 NOTE — Progress Notes (Signed)
SLP Cancellation Note  Patient Details Name: Tonya Henderson MRN: [id number] DOB: [birth year]   Cancelled treatment:       Reason Eval/Treat Not Completed: Fatigue/lethargy limiting ability to participate;Patient's level of consciousness; Pt inappropriate for speech/language evaluation at this time due to change in status. SLP will sign off. Reconsult as needed.  Thank you,  Havery MorosDabney Porter, CCC-SLP [phone]    PORTER,DABNEY 11/06/2015, 5:52 PM

## 2015-11-06 NOTE — Progress Notes (Signed)
ANTICOAGULATION CONSULT NOTE - Initial Consult  Pharmacy Consult for Heparin Indication: atrial fibrillation  No Known Allergies  Patient Measurements: Height: 5\' 4"  (162.6 cm) Weight: 132 lb 15 oz (60.3 kg) IBW/kg (Calculated) : 54.7 HEPARIN DW (KG): 64.4  Vital Signs: Temp: 97.6 F (36.4 C) (10/03 0440) Temp Source: Axillary (10/03 0440) BP: 162/77 (10/03 0600) Pulse Rate: 160 (10/03 0600)  Labs:  Recent Labs  11/03/15 0823 11/03/15 1507 11/03/15 2108 11/04/15 0404 11/05/15 1753 11/06/15 0420  HGB  --   --   --  9.9*  --  9.9*  HCT  --   --   --  30.4*  --  30.5*  PLT  --   --   --  213  --  225  HEPARINUNFRC  --   --   --   --  0.77* 0.32  CREATININE  --   --   --  1.23*  --  1.25*  TROPONINI 0.11* 0.11* 0.12*  --   --   --     Estimated Creatinine Clearance: 23.8 mL/min (by C-G formula based on SCr of 1.25 mg/dL (H)).   Medical History: Past Medical History:  Diagnosis Date  . Degenerated eye   . Essential hypertension   . Hyperlipidemia     Medications:  Prescriptions Prior to Admission  Medication Sig Dispense Refill Last Dose  . amLODipine (NORVASC) 10 MG tablet Take 10 mg by mouth daily.     11/02/2015 at Unknown time  . aspirin EC 81 MG tablet Take 81 mg by mouth daily.     11/02/2015 at Unknown time  . cyanocobalamin (,VITAMIN B-12,) 1000 MCG/ML injection Inject 1,000 mcg into the muscle every 30 (thirty) days.   Past Month at Unknown time  . folic acid (FOLVITE) 1 MG tablet Take 1 mg by mouth daily.     11/02/2015 at Unknown time  . hydrochlorothiazide (MICROZIDE) 12.5 MG capsule Take 12.5 mg by mouth daily.   11/02/2015 at Unknown time  . levothyroxine (SYNTHROID, LEVOTHROID) 25 MCG tablet Take 25 mcg by mouth daily before breakfast.   11/02/2015 at Unknown time  . olmesartan-hydrochlorothiazide (BENICAR HCT) 20-12.5 MG per tablet Take 1 tablet by mouth daily.     11/02/2015 at Unknown time  . rosuvastatin (CRESTOR) 20 MG tablet Take 20 mg by mouth  daily.     11/02/2015 at Unknown time    Assessment: 82 yo admitted with newly diagnosed afib and RVR, acute posterior CVA, presented as confusion and agitation. To start IV heparin for anticoagulation. Heparin level this AM is therapeutic.  Goal of Therapy:  Heparin level 0.3-0.7 units/ml Monitor platelets by anticoagulation protocol: Yes   Plan:  Continue heparin infusion at 800 units/hr Check anti-Xa level in 8 hours and daily while on heparin Continue to monitor H&H and platelets  Elder CyphersLorie Juliett Eastburn, BS Loura BackPharm D, BCPS Clinical Pharmacist Pager [phone] 11/06/2015,7:40 AM

## 2015-11-06 NOTE — Progress Notes (Signed)
ANTICOAGULATION CONSULT NOTE - Initial Consult  Pharmacy Consult for Heparin-->lovenox Indication: atrial fibrillation  No Known Allergies  Patient Measurements: Height: 5\' 4"  (162.6 cm) Weight: 132 lb 15 oz (60.3 kg) IBW/kg (Calculated) : 54.7 HEPARIN DW (KG): 64.4  Vital Signs: Temp: 97.8 F (36.6 C) (10/03 0821) Temp Source: Axillary (10/03 0821) BP: 169/66 (10/03 0800) Pulse Rate: 95 (10/03 0800)  Labs:  Recent Labs  11/03/15 1507 11/03/15 2108 11/04/15 0404 11/05/15 1753 11/06/15 0420  HGB  --   --  9.9*  --  9.9*  HCT  --   --  30.4*  --  30.5*  PLT  --   --  213  --  225  HEPARINUNFRC  --   --   --  0.77* 0.32  CREATININE  --   --  1.23*  --  1.25*  TROPONINI 0.11* 0.12*  --   --   --     Estimated Creatinine Clearance: 23.8 mL/min (by C-G formula based on SCr of 1.25 mg/dL (H)).   Medical History: Past Medical History:  Diagnosis Date  . Degenerated eye   . Essential hypertension   . Hyperlipidemia     Medications:  Prescriptions Prior to Admission  Medication Sig Dispense Refill Last Dose  . amLODipine (NORVASC) 10 MG tablet Take 10 mg by mouth daily.     11/02/2015 at Unknown time  . aspirin EC 81 MG tablet Take 81 mg by mouth daily.     11/02/2015 at Unknown time  . cyanocobalamin (,VITAMIN B-12,) 1000 MCG/ML injection Inject 1,000 mcg into the muscle every 30 (thirty) days.   Past Month at Unknown time  . folic acid (FOLVITE) 1 MG tablet Take 1 mg by mouth daily.     11/02/2015 at Unknown time  . hydrochlorothiazide (MICROZIDE) 12.5 MG capsule Take 12.5 mg by mouth daily.   11/02/2015 at Unknown time  . levothyroxine (SYNTHROID, LEVOTHROID) 25 MCG tablet Take 25 mcg by mouth daily before breakfast.   11/02/2015 at Unknown time  . olmesartan-hydrochlorothiazide (BENICAR HCT) 20-12.5 MG per tablet Take 1 tablet by mouth daily.     11/02/2015 at Unknown time  . rosuvastatin (CRESTOR) 20 MG tablet Take 20 mg by mouth daily.     11/02/2015 at Unknown time     Assessment: 82 yo admitted with newly diagnosed afib and RVR, acute posterior CVA, presented as confusion and agitation. Heparin infusion has been discontinued, will transition to Lovenox SQ for anticoagulation.  Goal of Therapy:  Monitor platelets by anticoagulation protocol: Yes   Plan:  Lovenox 1mg /kg SQ q24hrs (60mg ) Continue to monitor H&H and platelets  Elder CyphersLorie Kennia Vanvorst, BS Loura BackPharm D, BCPS Clinical Pharmacist Pager [phone] 11/06/2015,10:25 AM

## 2015-11-06 NOTE — Progress Notes (Signed)
[place] A. Merlene Laughter, MD     [email or web address]          Tonya Henderson is an 82 y.o. female.   Assessment/Plan: Severe encephalopathy which I think is most likely coming from bilateral posterior circulation/PCA infarcts. 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 She is likely having symptoms of encephalopathy due to ischemia to brain stem structures. This can be a very serious condition resulting in severe neurological deficits and the high risk of mortality. We could do a brain MRA to confirm the suspicion of vascular insufficiency. This may not ultimately change what we do or her prognosis. We certainly will follow the patient's neurological exam. I had a very extensive discussion with multiple family members including her children. I did discuss the likely poor prognosis. We discuss IV fluids and tube feeds. All family members are currently not in supportive place in tube feeds/artificial feeds. However, they won some more time to think about withdrawal of care and hospice/palliative care. For now we'll continue with the current supportive care with IV fluids. She is a DO NOT RESUSCITATE. Again, we'll repeat her neurological examination and continue to follow up with the family.   The patient overall is unchanged. She may be slightly improved today with partial eye opening.    GENERAL: Unresponsive.  HEENT: Supple. Atraumatic normocephalic.   ABDOMEN: soft  EXTREMITIES: No edema   BACK: Normal.  SKIN: Normal by inspection.    MENTAL STATUS: She does moans and groans. She does open eyes partially to painful stimuli.  CRANIAL NERVES: Pupils are equal, round and reactive to light; The eyes are deviated to the left but can be overcome pass midline with oculocephalic reflexes to about 80%; The patient is noted to have tonic movements of the eyes (which are deviated towards the left) to the midline and  then deviates back to the left with a few beats of nystagmus. Vestibular ocular reflex using cold colorics in the right ear was given with the eye deviating towards the right. Upper and lower facial muscles are normal in strength and symmetric, there is no flattening of the nasolabial folds.  MOTOR: The patient withdraws to deep painful stimuli. Complete drift of all 4 extremities.  COORDINATION: Left finger to nose is normal, right finger to nose is normal, No rest tremor; no intention tremor; no postural tremor; no bradykinesia.  REFLEXES: Deep tendon reflexes are symmetrical and normal.   SENSATION: Responsive the pain was similar bilaterally.        Objective: Vital signs in last 24 hours: Temp:  [97.6 F (36.4 C)-98.6 F (37 C)] 97.7 F (36.5 C) (10/03 1148) Pulse Rate:  [60-160] 79 (10/03 1600) Resp:  [15-28] 19 (10/03 1600) BP: (126-177)/(35-77) 169/56 (10/03 1600) SpO2:  [89 %-99 %] 97 % (10/03 1600) Weight:  [132 lb 15 oz (60.3 kg)] 132 lb 15 oz (60.3 kg) (10/03 0440)  Intake/Output from previous day: 10/02 0701 - 10/03 0700 In: 2997.8 [I.V.:2997.8] Out: 800 [Urine:800] Intake/Output this shift: Total I/O In: 680 [I.V.:680] Out: -  Nutritional status: Diet Heart Room service appropriate? Yes; Fluid consistency: Thin   Lab Results: Results for orders placed or performed during the hospital encounter of 11/03/15 (from the past 48 hour(s))  Glucose, capillary     Status: Abnormal   Collection Time: 11/04/15  8:59 PM  Result Value Ref Range   Glucose-Capillary 138 (H) 65 - 99 mg/dL   Comment 1 Notify RN  Glucose, capillary     Status: Abnormal   Collection Time: 11/05/15  7:48 AM  Result Value Ref Range   Glucose-Capillary 153 (H) 65 - 99 mg/dL   Comment 1 Notify RN    Comment 2 Document in Chart   Glucose, capillary     Status: Abnormal   Collection Time: 11/05/15 11:42 AM  Result Value Ref Range   Glucose-Capillary 140 (H) 65 - 99 mg/dL   Comment  1 Notify RN    Comment 2 Document in Chart   Glucose, capillary     Status: Abnormal   Collection Time: 11/05/15  4:44 PM  Result Value Ref Range   Glucose-Capillary 135 (H) 65 - 99 mg/dL   Comment 1 Notify RN    Comment 2 Document in Chart   Heparin level (unfractionated)     Status: Abnormal   Collection Time: 11/05/15  5:53 PM  Result Value Ref Range   Heparin Unfractionated 0.77 (H) 0.30 - 0.70 IU/mL    Comment:        IF HEPARIN RESULTS ARE BELOW EXPECTED VALUES, AND PATIENT DOSAGE HAS BEEN CONFIRMED, SUGGEST FOLLOW UP TESTING OF ANTITHROMBIN III LEVELS.   Glucose, capillary     Status: Abnormal   Collection Time: 11/05/15  7:39 PM  Result Value Ref Range   Glucose-Capillary 153 (H) 65 - 99 mg/dL  Comprehensive metabolic panel     Status: Abnormal   Collection Time: 11/06/15  4:20 AM  Result Value Ref Range   Sodium 139 135 - 145 mmol/L   Potassium 3.4 (L) 3.5 - 5.1 mmol/L   Chloride 112 (H) 101 - 111 mmol/L   CO2 21 (L) 22 - 32 mmol/L   Glucose, Bld 164 (H) 65 - 99 mg/dL   BUN 34 (H) 6 - 20 mg/dL   Creatinine, Ser 1.25 (H) 0.44 - 1.00 mg/dL   Calcium 9.4 8.9 - 10.3 mg/dL   Total Protein 5.9 (L) 6.5 - 8.1 g/dL   Albumin 2.8 (L) 3.5 - 5.0 g/dL   AST 20 15 - 41 U/L   ALT 13 (L) 14 - 54 U/L   Alkaline Phosphatase 50 38 - 126 U/L   Total Bilirubin 1.0 0.3 - 1.2 mg/dL   GFR calc non Af Amer 36 (L) >60 mL/min   GFR calc Af Amer 41 (L) >60 mL/min    Comment: (NOTE) The eGFR has been calculated using the CKD EPI equation. This calculation has not been validated in all clinical situations. eGFR's persistently <60 mL/min signify possible Chronic Kidney Disease.    Anion gap 6 5 - 15  CBC     Status: Abnormal   Collection Time: 11/06/15  4:20 AM  Result Value Ref Range   WBC 16.7 (H) 4.0 - 10.5 K/uL   RBC 3.41 (L) 3.87 - 5.11 MIL/uL   Hemoglobin 9.9 (L) 12.0 - 15.0 g/dL   HCT 30.5 (L) 36.0 - 46.0 %   MCV 89.4 78.0 - 100.0 fL   MCH 29.0 26.0 - 34.0 pg   MCHC 32.5  30.0 - 36.0 g/dL   RDW 14.1 11.5 - 15.5 %   Platelets 225 150 - 400 K/uL  Heparin level (unfractionated)     Status: None   Collection Time: 11/06/15  4:20 AM  Result Value Ref Range   Heparin Unfractionated 0.32 0.30 - 0.70 IU/mL    Comment:        IF HEPARIN RESULTS ARE BELOW EXPECTED VALUES, AND PATIENT DOSAGE HAS BEEN  CONFIRMED, SUGGEST FOLLOW UP TESTING OF ANTITHROMBIN III LEVELS.   Glucose, capillary     Status: Abnormal   Collection Time: 11/06/15  7:30 AM  Result Value Ref Range   Glucose-Capillary 135 (H) 65 - 99 mg/dL   Comment 1 Notify RN    Comment 2 Document in Chart   Glucose, capillary     Status: Abnormal   Collection Time: 11/06/15 11:21 AM  Result Value Ref Range   Glucose-Capillary 139 (H) 65 - 99 mg/dL   Comment 1 Notify RN    Comment 2 Document in Chart     Lipid Panel  Recent Labs  11/04/15 0404  CHOL 108  TRIG 59  HDL 57  CHOLHDL 1.9  VLDL 12  LDLCALC 39    Studies/Results:   Medications:  Scheduled Meds: . aspirin  300 mg Rectal Daily   Or  . aspirin  325 mg Oral Daily  . enoxaparin (LOVENOX) injection  60 mg Subcutaneous Q24H  . mouth rinse  15 mL Mouth Rinse BID   Continuous Infusions: . sodium chloride 75 mL/hr at 11/06/15 0600   PRN Meds:.metoprolol     LOS: 3 days   Maddison Kilner A. Merlene Laughter, M.D.  Diplomate, Tax adviser of Psychiatry and Neurology ( Neurology).

## 2015-11-06 NOTE — Progress Notes (Signed)
PROGRESS NOTE    AKILI CUDA  ZOX:[id number] DOB: 04/07/1919 DOA: 11/03/2015 PCP: Pearson Grippe, MD   Brief Narrative:  50 yof admitted with newly diagnosed afib and RVR, acute posterior CVA, presented as confusion and agitation. MRI showed multi-infarcts posterior circulation (both cerebella).  ECHO OK, carotid with plaques and 50-60 percent stenosis. Lipid is excellent on no meds. She is still intermittently agitated and lethargic. She was able to swallow but is too sleepy to take orally. She was able to walk to the BR 3 days ago. No sedative meds given.  Neurology and cardiolgy have seen her.  Prognosis was felt to be poor.  Her EEG did not show any seizure activites.  Assessment & Plan:   1. Acute CVA: Continue current treatment. Will transition to Lovenox full dose from IV Heparin.   She is 4 days post CVA now.  EEG shows severe global slowing with triphasic waves.  Discussed with family that she may not recover from this incident.   Continue with DNR and consider palliative care consult.  Family is amenable if she doesn't improve in 1-2 days.  Cpncern that she may have brain stem infarct not seen in the MRI.  2. Afib with RVR (CHADS 5): Will d/c Cardiazem IV.  Per cardiology, can use Lopressor or IV cardiazem PRN.  MRI did not show large CVA and minimal edema.  3. HTN: Will allow permissive HTN.  4. Elevated troponin: Demand ischemia. No further work up is planned.  DVT prophylaxis: IV Heparin  Code Status: DNR  Family Communication: Daughter bedside Disposition Plan: Discharge home once improved.     Consultants:   Neurology   Cardiology   Procedures:   EEG  This recording shows moderately severe global slowing. There is  also triphasic waves. Both of these conditions are associated  with global encephalopathies particularly from metabolic causes  but can be seen with that encephalopathies of other causes.  Echo  - Left ventricle: The cavity size was normal. Wall  thickness was   increased in a pattern of moderate LVH. Systolic function was   normal. The estimated ejection fraction was in the range of 60%   to 65%. Wall motion was normal; there were no regional wall   motion abnormalities. The study is not technically sufficient to   allow evaluation of LV diastolic function. - Aortic valve: Mildly calcified annulus. Trileaflet; mildly   calcified leaflets. Moderate calcification and nodularity   involving the left coronary cusp. Mean gradient (S): 5 mm Hg.   Valve area (VTI): 2.24 cm^2. - Mitral valve: There was mild regurgitation. - Left atrium: The atrium was severely dilated. - Right atrium: The atrium was mildly dilated. Central venous   pressure (est): 8 mm Hg. - Atrial septum: There was a patent foramen ovale present based on   color Doppler evaluation. - Tricuspid valve: There was mild regurgitation. - Pulmonary arteries: Systolic pressure was moderately increased.   PA peak pressure: 50 mm Hg (S). - Pericardium, extracardiac: A small to moderate pericardial   effusion was identified circumferential to the heart. There is   evidence of organization noted anterior to the right ventricle.  Antimicrobials:   None    Subjective: Remains unresponsive.   Objective: Vitals:   11/06/15 0515 11/06/15 0530 11/06/15 0545 11/06/15 0600  BP:  (!) 158/74  (!) 162/77  Pulse: 83 97 81 (!) 160  Resp: 20 20 (!) 21 (!) 23  Temp:      TempSrc:  SpO2: 96% 95% 96% (!) 89%  Weight:      Height:        Intake/Output Summary (Last 24 hours) at 11/06/15 0607 Last data filed at 11/06/15 0600  Gross per 24 hour  Intake          2997.78 ml  Output              800 ml  Net          2197.78 ml   Filed Weights   11/04/15 0500 11/05/15 0500 11/06/15 0440  Weight: 61.5 kg (135 lb 9.3 oz) 59.2 kg (130 lb 8.2 oz) 60.3 kg (132 lb 15 oz)    Examination:  General exam: Appears calm and comfortable  Respiratory system: Clear to auscultation.  Respiratory effort normal. Cardiovascular system: S1 & S2 heard, RRR. No JVD, murmurs, rubs, gallops or clicks. No pedal edema. Gastrointestinal system: Abdomen is nondistended, soft and nontender. No organomegaly or masses felt. Normal bowel sounds heard. Central nervous system: Alert and oriented. No focal neurological deficits. Extremities: Symmetric 5 x 5 power. Skin: No rashes, lesions or ulcers Psychiatry: Judgement and insight appear normal. Mood & affect appropriate.     Data Reviewed: I have personally reviewed following labs and imaging studies  CBC:  Recent Labs Lab 11/03/15 0443 11/04/15 0404 11/06/15 0420  WBC 8.1 9.4 16.7*  NEUTROABS 3.4  --   --   HGB 10.4* 9.9* 9.9*  HCT 31.9* 30.4* 30.5*  MCV 88.6 88.4 89.4  PLT 203 213 225   Basic Metabolic Panel:  Recent Labs Lab 11/03/15 0443 11/04/15 0404  NA 135 136  K 3.2* 3.4*  CL 108 108  CO2 21* 23  GLUCOSE 172* 109*  BUN 43* 32*  CREATININE 1.63* 1.23*  CALCIUM 9.5 9.3   GFR: Estimated Creatinine Clearance: 24.2 mL/min (by C-G formula based on SCr of 1.23 mg/dL (H)). Liver Function Tests:  Recent Labs Lab 11/03/15 0443 11/04/15 0404  AST 14* 17  ALT 10* 10*  ALKPHOS 57 54  BILITOT 0.6 0.6  PROT 6.5 5.9*  ALBUMIN 3.3* 3.0*   Coagulation Profile:  Recent Labs Lab 11/03/15 0443  INR 1.07   Cardiac Enzymes:  Recent Labs Lab 11/03/15 0443 11/03/15 0823 11/03/15 1507 11/03/15 2108  TROPONINI 0.12* 0.11* 0.11* 0.12*   HbA1C:  Recent Labs  11/04/15 0404  HGBA1C 5.9*   CBG:  Recent Labs Lab 11/04/15 2059 11/05/15 0748 11/05/15 1142 11/05/15 1644 11/05/15 1939  GLUCAP 138* 153* 140* 135* 153*   Lipid Profile:  Recent Labs  11/04/15 0404  CHOL 108  HDL 57  LDLCALC 39  TRIG 59  CHOLHDL 1.9   Sepsis Labs:  Recent Labs Lab 11/03/15 0457  LATICACIDVEN 1.54    Recent Results (from the past 240 hour(s))  MRSA PCR Screening     Status: None   Collection Time:  11/03/15  8:22 AM  Result Value Ref Range Status   MRSA by PCR NEGATIVE NEGATIVE Final    Comment:        The GeneXpert MRSA Assay (FDA approved for NASAL specimens only), is one component of a comprehensive MRSA colonization surveillance program. It is not intended to diagnose MRSA infection nor to guide or monitor treatment for MRSA infections.          Radiology Studies: No results found.      Scheduled Meds: . aspirin  300 mg Rectal Daily   Or  . aspirin  325 mg Oral Daily  .  diltiazem  10 mg Intravenous Once  . mouth rinse  15 mL Mouth Rinse BID   Continuous Infusions: . sodium chloride 75 mL/hr at 11/06/15 0600  . diltiazem (CARDIZEM) infusion Stopped (11/06/15 0600)  . heparin 800 Units/hr (11/06/15 0600)     LOS: 3 days   Time spent: 25 minutes    Houston Siren, MD FACP [facility] If 7PM-7AM, please contact night-coverage [email or web address] Password [email or web address] 11/06/2015, 6:07 AM   By signing my name below, I, Cynda Acres, attest that this documentation has been prepared under the direction and in the presence of Houston Siren, MD. Electronically signed: Cynda Acres, Scribe. 11/06/15 7:40 AM

## 2015-11-07 ENCOUNTER — Encounter (HOSPITAL_COMMUNITY): Payer: Self-pay | Admitting: Primary Care

## 2015-11-07 DIAGNOSIS — Z515 Encounter for palliative care: Secondary | ICD-10-CM

## 2015-11-07 DIAGNOSIS — Z7189 Other specified counseling: Secondary | ICD-10-CM

## 2015-11-07 LAB — CBC
HCT: 29.1 % — ABNORMAL LOW (ref 36.0–46.0)
HEMOGLOBIN: 9.4 g/dL — AB (ref 12.0–15.0)
MCH: 29 pg (ref 26.0–34.0)
MCHC: 32.3 g/dL (ref 30.0–36.0)
MCV: 89.8 fL (ref 78.0–100.0)
Platelets: 200 10*3/uL (ref 150–400)
RBC: 3.24 MIL/uL — ABNORMAL LOW (ref 3.87–5.11)
RDW: 14.2 % (ref 11.5–15.5)
WBC: 11.9 10*3/uL — AB (ref 4.0–10.5)

## 2015-11-07 MED ORDER — LORAZEPAM 2 MG/ML IJ SOLN
1.0000 mg | INTRAMUSCULAR | Status: DC | PRN
  Administered 2015-11-07 – 2015-11-08 (×4): 1 mg via INTRAVENOUS
  Filled 2015-11-07 (×4): qty 1

## 2015-11-07 MED ORDER — MORPHINE SULFATE (CONCENTRATE) 10 MG/0.5ML PO SOLN
2.5000 mg | ORAL | Status: DC
  Administered 2015-11-07 – 2015-11-08 (×5): 2.6 mg via SUBLINGUAL
  Filled 2015-11-07 (×5): qty 0.5

## 2015-11-07 MED ORDER — MORPHINE SULFATE (CONCENTRATE) 10 MG/0.5ML PO SOLN: 2.5000 mg | ORAL | Status: DC | PRN

## 2015-11-07 NOTE — Progress Notes (Signed)
Consulting cardiologist: Dr. Jonelle Sidle  Seen for followup: Atrial fibrillation  Subjective:    Patient remains unresponsive.  Objective:   Temp:  [97 F (36.1 C)-97.7 F (36.5 C)] 97.7 F (36.5 C) (10/04 0800) Pulse Rate:  [55-144] 127 (10/04 0800) Resp:  [13-30] 20 (10/04 0800) BP: (126-192)/(43-151) 192/88 (10/04 0800) SpO2:  [86 %-99 %] 94 % (10/04 0800) Weight:  [133 lb 6.1 oz (60.5 kg)] 133 lb 6.1 oz (60.5 kg) (10/04 0500) Last BM Date: 11/05/15  Filed Weights   11/05/15 0500 11/06/15 0440 11/07/15 0500  Weight: 130 lb 8.2 oz (59.2 kg) 132 lb 15 oz (60.3 kg) 133 lb 6.1 oz (60.5 kg)    Intake/Output Summary (Last 24 hours) at 11/07/15 0831 Last data filed at 11/07/15 0800  Gross per 24 hour  Intake             1730 ml  Output             1000 ml  Net              730 ml    Telemetry: Atrial fibrillation, fluctuating heart rate with RVR.  Exam:  General: Elderly woman, noncommunicative.  Lungs: Coarse breath sounds.  Cardiac: Irregularly irregular.  Extremities: Noncommunicative, encephalopathic.  Lab Results:  Basic Metabolic Panel:  Recent Labs Lab 11/03/15 0443 11/04/15 0404 11/06/15 0420  NA 135 136 139  K 3.2* 3.4* 3.4*  CL 108 108 112*  CO2 21* 23 21*  GLUCOSE 172* 109* 164*  BUN 43* 32* 34*  CREATININE 1.63* 1.23* 1.25*  CALCIUM 9.5 9.3 9.4    CBC:  Recent Labs Lab 11/04/15 0404 11/06/15 0420 11/07/15 0454  WBC 9.4 16.7* 11.9*  HGB 9.9* 9.9* 9.4*  HCT 30.4* 30.5* 29.1*  MCV 88.4 89.4 89.8  PLT 213 225 200    Cardiac Enzymes:  Recent Labs Lab 11/03/15 0823 11/03/15 1507 11/03/15 2108  TROPONINI 0.11* 0.11* 0.12*    Echocardiogram 11/03/2015: Study Conclusions  - Left ventricle: The cavity size was normal. Wall thickness was   increased in a pattern of moderate LVH. Systolic function was   normal. The estimated ejection fraction was in the range of 60%   to 65%. Wall motion was normal; there were no  regional wall   motion abnormalities. The study is not technically sufficient to   allow evaluation of LV diastolic function. - Aortic valve: Mildly calcified annulus. Trileaflet; mildly   calcified leaflets. Moderate calcification and nodularity   involving the left coronary cusp. Mean gradient (S): 5 mm Hg.   Valve area (VTI): 2.24 cm^2. - Mitral valve: There was mild regurgitation. - Left atrium: The atrium was severely dilated. - Right atrium: The atrium was mildly dilated. Central venous   pressure (est): 8 mm Hg. - Atrial septum: There was a patent foramen ovale present based on   color Doppler evaluation. - Tricuspid valve: There was mild regurgitation. - Pulmonary arteries: Systolic pressure was moderately increased.   PA peak pressure: 50 mm Hg (S). - Pericardium, extracardiac: A small to moderate pericardial   effusion was identified circumferential to the heart. There is   evidence of organization noted anterior to the right ventricle.  Impressions:  - Moderate LVH with LVEF 60-65%. Indeterminate diastolic function   in the setting of atrial fibrillation. Severe left atrial   enlargement. Mild mitral regurgitation. Mildly sclerotic aortic   valve with moderate nodular calcination of the left coronary   cusp. Mild  tricuspid regurgitation with PASP estimated 50 mmHg.   Mild right atrial enlargement. There is evidence of PFO based on   color Doppler interrogation. Small to moderate sized pericardial   effusion noted with evidence of organization anterior to the   right ventricle.   Medications:   Scheduled Medications: . aspirin  300 mg Rectal Daily   Or  . aspirin  325 mg Oral Daily  . enoxaparin (LOVENOX) injection  60 mg Subcutaneous Q24H  . mouth rinse  15 mL Mouth Rinse BID    Infusions: . sodium chloride 75 mL/hr at 11/07/15 0600     Assessment:   1. Newly documented atrial fibrillation in the setting of acute stroke. CHADSVASC score is 5. Heart rate  Fluctuating with some RVR. She was taken off IV Cardizem and now has IV Lopressor as needed. She is also on Lovenox. Unable to take oral agents now with encephalopathy.  2. Mild troponin I elevation in flat pattern, not suggestive of ACS. LVEF 60-65% by echocardiogram without wall motion abnormalities.  3. Moderate bilateral carotid artery disease, 50-69% stenoses by recent Dopplers.  4. PFO by color Doppler imaging.  5. Multifocal acute brain infarcts, particularly in both PCA territories as outlined by brain MRI. Patient has had progressive encephalopathy. Prognosis is poor.   Plan/Discussion:    Patient has not shown significant improvement, being followed by Neurology at this time and overall with poor prognosis. She has DNR status in place. Would consider palliative care consultation. Further cardiac workup is not planned at this time. Agree with IV Lopressor for heart rate control, could place this on a standing regimen if needed. We will sign off for now.   Jonelle SidleSamuel G. McDowell, M.D., F.A.C.C.

## 2015-11-07 NOTE — Consult Note (Signed)
Consultation Note Date: 11/07/2015   Patient Name: Rondall AllegraMyrtle S Furness  DOB: [birth year]  MRN: [id number]  Age / Sex: 82 y.o., female  PCP: Pearson GrippeJames Kim, MD Referring Physician: Micael HampshireEstela Y Hernandez Acost*  Reason for Consultation: Establishing goals of care and Psychosocial/spiritual support  HPI/Patient Profile: 82 y.o. female  with past medical history of CHF, high cholesterol, hypertension admitted on 11/03/2015 with CVA.   Clinical Assessment and Goals of Care: Ms. Fredia Sorrowurdue is unable to communicate with me in any meaningful manner. She is resting quietly in a dark room with family member at bedside.  Family meeting and family waiting room with daughter,  Maralyn SagoSarah "Arline AspCindy" Werner LeanMiller, Wilma, Dorothy, Betty, and a brother who arrives later in our conversation. We talk about Mrs. Purdue's chronic illness. Arline AspCindy shares that until this illness, Mrs. Fredia Sorrowurdue had not been hospitalized, and had minimal health problems. She goes on to tell about their daily life, how Mrs. Purdue is independent with ADLs, bathing and dressing, and had enjoyed good mobility. We talk about end-of-life, expected changes, and treatment plans. Family agrees to scheduled morphine for respiratory comfort. We talk about the concepts of allow a natural death, and Arline AspCindy states that their mother was very good to them, and they don't want her to continue to suffer. We talk about the benefits [place] [facility] and they elect transfer they are instead of trying to care for Mrs. Purdue at home. [facility], full comfort care.   Healthcare power of attorney NEXT OF KIN - daughter Maralyn SagoSarah "Arline AspCindy" Hyacinth MeekerMiller is durable power of attorney, she also is the primary decision maker for her mother. Family does make decisions as a group.   SUMMARY OF RECOMMENDATIONS   full comfort care, [facility]  Code Status/Advance Care Planning:  DNR  Symptom Management:   per hospitalist  Palliative Prophylaxis:   Aspiration, Frequent Pain Assessment and Turn Reposition  Additional Recommendations (Limitations, Scope, Preferences):  Full Comfort Care  Psycho-social/Spiritual:   Desire for further Chaplaincy support:no  Additional Recommendations: Caregiving  Support/Resources and Education on Hospice  Prognosis:   < 2 weeks, likely based on severity of stroke, families desire to focus on comfort and dignity.  [facility]      Primary Diagnoses: Present on Admission: . Atrial fibrillation with RVR (HCC) . Acute CVA (cerebrovascular accident) (HCC) . HTN (hypertension) . Elevated troponin   I have reviewed the medical record, interviewed the patient and family, and examined the patient. The following aspects are pertinent.  Past Medical History:  Diagnosis Date  . Degenerated eye   . Essential hypertension   . Hyperlipidemia    Social History   Social History  . Marital status: Widowed    Spouse name: N/A  . Number of children: N/A  . Years of education: 7th grade   Occupational History  . ? Retired   Social History Main Topics  . Smoking status: Never Smoker  .  Smokeless tobacco: Current User    Types: Snuff  . Alcohol use No  . Drug use: No  . Sexual activity: No   Other Topics Concern  . None   Social History Narrative  . None   Family History  Problem Relation Age of Onset  . Heart disease    . Lung disease    . Heart disease Sister   . Heart disease Brother   . Anesthesia problems Neg Hx   . Hypotension Neg Hx   . Pseudochol deficiency Neg Hx   . Malignant hyperthermia Neg Hx    Scheduled Meds: . mouth rinse  15 mL Mouth Rinse BID   Continuous Infusions:  PRN Meds:.LORazepam, morphine CONCENTRATE Medications Prior to Admission:  Prior to Admission medications   Medication Sig  Start Date End Date Taking? Authorizing Provider  amLODipine (NORVASC) 10 MG tablet Take 10 mg by mouth daily.     Yes Historical Provider, MD  aspirin EC 81 MG tablet Take 81 mg by mouth daily.     Yes Historical Provider, MD  cyanocobalamin (,VITAMIN B-12,) 1000 MCG/ML injection Inject 1,000 mcg into the muscle every 30 (thirty) days.   Yes Historical Provider, MD  folic acid (FOLVITE) 1 MG tablet Take 1 mg by mouth daily.     Yes Historical Provider, MD  hydrochlorothiazide (MICROZIDE) 12.5 MG capsule Take 12.5 mg by mouth daily.   Yes Historical Provider, MD  levothyroxine (SYNTHROID, LEVOTHROID) 25 MCG tablet Take 25 mcg by mouth daily before breakfast.   Yes Historical Provider, MD  olmesartan-hydrochlorothiazide (BENICAR HCT) 20-12.5 MG per tablet Take 1 tablet by mouth daily.     Yes Historical Provider, MD  rosuvastatin (CRESTOR) 20 MG tablet Take 20 mg by mouth daily.     Yes Historical Provider, MD   No Known Allergies Review of Systems  Unable to perform ROS: Acuity of condition    Physical Exam  Constitutional: No distress.  HENT:  Head: Normocephalic and atraumatic.  Cardiovascular: Normal rate and regular rhythm.   Pulmonary/Chest: No respiratory distress.  Abdominal: Soft. She exhibits no distension.  Skin: Skin is warm and dry.  Nursing note and vitals reviewed.   Vital Signs: BP (!) 180/55 (BP Location: Left Arm)   Pulse 90   Temp 97.7 F (36.5 C) (Oral)   Resp 17   Ht 5\' 4"  (1.626 m)   Wt 60.5 kg (133 lb 6.1 oz)   SpO2 97%   BMI 22.89 kg/m  Pain Assessment: Faces   Pain Score: 0-No pain   SpO2: SpO2: 97 % O2 Device:SpO2: 97 % O2 Flow Rate: .O2 Flow Rate (L/min): 3 L/min  IO: Intake/output summary:  Intake/Output Summary (Last 24 hours) at 11/07/15 1330 Last data filed at 11/07/15 1200  Gross per 24 hour  Intake             1880 ml  Output             1400 ml  Net              480 ml    LBM: Last BM Date: 11/03/15 Baseline Weight: Weight: 64.4  kg (142 lb) Most recent weight: Weight: 60.5 kg (133 lb 6.1 oz)     Palliative Assessment/Data:   Flowsheet Rows   Flowsheet Row Most Recent Value  Intake Tab  Referral Department  Hospitalist  Unit at Time of Referral  ICU  Palliative Care Primary Diagnosis  Neurology  Date Notified  11/07/15  Palliative Care Type  New Palliative care  Date of Admission  11/03/15  Date first seen by Palliative Care  11/07/15  # of days Palliative referral response time  0 Day(s)  # of days IP prior to Palliative referral  4  Clinical Assessment  Palliative Performance Scale Score  20%  Pain Max last 24 hours  Not able to report  Pain Min Last 24 hours  Not able to report  Dyspnea Max Last 24 Hours  Not able to report  Dyspnea Min Last 24 hours  Not able to report  Psychosocial & Spiritual Assessment  Palliative Care Outcomes  Patient/Family meeting held?  Yes  Who was at the meeting?  multiple familty memebers   Palliative Care Outcomes  Provided advance care planning, Provided psychosocial or spiritual support, Provided end of life care assistance, Clarified goals of care  Patient/Family wishes: Interventions discontinued/not started   Mechanical Ventilation, Tube feedings/TPN  Palliative Care follow-up planned  -- [follow up while at [facility]]      Time In: 1310 Time Out: 1345 Time Total: 35 minutes Greater than 50%  of this time was spent counseling and coordinating care related to the above assessment and plan.  Signed by: Katheran Awe, NP   Please contact Palliative Medicine Team phone at [phone] for questions and concerns.  For individual provider: See Loretha Stapler

## 2015-11-07 NOTE — Progress Notes (Signed)
PROGRESS NOTE    Tonya Henderson  AVW:[id number] DOB: 21-Apr-1919 DOA: 11/03/2015 PCP: Pearson GrippeJames Kim, MD     Brief Narrative:  82 y/o woman admitted from home on 9/30 with confusion. MRI with multiple posterior circulation CVAs. She remains lethargic. Neurology believes poor prognosis. Long discussion with family on 10/4. They have agreed to proceed with comfort care.   Assessment & Plan:   Principal Problem:   Acute CVA (cerebrovascular accident) (HCC) Active Problems:   Atrial fibrillation with RVR (HCC)   HTN (hypertension)   History of congestive heart failure   Elevated troponin   Multiple Acute CVAs A fib with RVR HTN Elevated troponin  Family has elected to proceed with comfort care. Will DC all  Medications per their request. Start ativan and roxanol as needed. Family to decide on hospice home vs home with hospice. Will allow 24 hours in the hospital to see how she evolved.   DVT prophylaxis: None (comfort care) Code Status: DNR Family Communication: Over 3915 family members were present during our conversation this am. Disposition Plan: hospice  Consultants:   Neurology  cardiology  Procedures:   none  Antimicrobials:   none    Subjective: lethargic  Objective: Vitals:   11/07/15 0600 11/07/15 0800 11/07/15 1000 11/07/15 1014  BP: (!) 140/48 (!) 192/88  (!) 180/55  Pulse: 77 (!) 127 91 90  Resp: 13 20 20 17   Temp:  97.7 F (36.5 C)    TempSrc:  Oral    SpO2: 97% 94%  97%  Weight:      Height:        Intake/Output Summary (Last 24 hours) at 11/07/15 1208 Last data filed at 11/07/15 1000  Gross per 24 hour  Intake             1880 ml  Output             1400 ml  Net              480 ml   Filed Weights   11/05/15 0500 11/06/15 0440 11/07/15 0500  Weight: 59.2 kg (130 lb 8.2 oz) 60.3 kg (132 lb 15 oz) 60.5 kg (133 lb 6.1 oz)    Examination:  General exam: Lethargic Respiratory system: Clear to auscultation. Respiratory effort  normal. Cardiovascular system:irregular, No murmurs, rubs, gallops. Gastrointestinal system: Abdomen is nondistended, soft and nontender. No organomegaly or masses felt. Normal bowel sounds heard. Central nervous system: lethargic, obtunded Extremities: no swelling Skin: No rashes, lesions or ulcers    Data Reviewed: I have personally reviewed following labs and imaging studies  CBC:  Recent Labs Lab 11/03/15 0443 11/04/15 0404 11/06/15 0420 11/07/15 0454  WBC 8.1 9.4 16.7* 11.9*  NEUTROABS 3.4  --   --   --   HGB 10.4* 9.9* 9.9* 9.4*  HCT 31.9* 30.4* 30.5* 29.1*  MCV 88.6 88.4 89.4 89.8  PLT 203 213 225 200   Basic Metabolic Panel:  Recent Labs Lab 11/03/15 0443 11/04/15 0404 11/06/15 0420  NA 135 136 139  K 3.2* 3.4* 3.4*  CL 108 108 112*  CO2 21* 23 21*  GLUCOSE 172* 109* 164*  BUN 43* 32* 34*  CREATININE 1.63* 1.23* 1.25*  CALCIUM 9.5 9.3 9.4   GFR: Estimated Creatinine Clearance: 23.8 mL/min (by C-G formula based on SCr of 1.25 mg/dL (H)). Liver Function Tests:  Recent Labs Lab 11/03/15 0443 11/04/15 0404 11/06/15 0420  AST 14* 17 20  ALT 10* 10* 13*  ALKPHOS  57 54 50  BILITOT 0.6 0.6 1.0  PROT 6.5 5.9* 5.9*  ALBUMIN 3.3* 3.0* 2.8*   No results for input(s): LIPASE, AMYLASE in the last 168 hours. No results for input(s): AMMONIA in the last 168 hours. Coagulation Profile:  Recent Labs Lab 11/03/15 0443  INR 1.07   Cardiac Enzymes:  Recent Labs Lab 11/03/15 0443 11/03/15 0823 11/03/15 1507 11/03/15 2108  TROPONINI 0.12* 0.11* 0.11* 0.12*   BNP (last 3 results) No results for input(s): PROBNP in the last 8760 hours. HbA1C: No results for input(s): HGBA1C in the last 72 hours. CBG:  Recent Labs Lab 11/05/15 1939 11/06/15 0730 11/06/15 1121 11/06/15 1757 11/06/15 2118  GLUCAP 153* 135* 139* 133* 137*   Lipid Profile: No results for input(s): CHOL, HDL, LDLCALC, TRIG, CHOLHDL, LDLDIRECT in the last 72 hours. Thyroid  Function Tests: No results for input(s): TSH, T4TOTAL, FREET4, T3FREE, THYROIDAB in the last 72 hours. Anemia Panel: No results for input(s): VITAMINB12, FOLATE, FERRITIN, TIBC, IRON, RETICCTPCT in the last 72 hours. Urine analysis:    Component Value Date/Time   COLORURINE YELLOW 11/03/2015 0443   APPEARANCEUR CLEAR 11/03/2015 0443   LABSPEC 1.025 11/03/2015 0443   PHURINE 5.0 11/03/2015 0443   GLUCOSEU NEGATIVE 11/03/2015 0443   HGBUR TRACE (A) 11/03/2015 0443   BILIRUBINUR NEGATIVE 11/03/2015 0443   KETONESUR NEGATIVE 11/03/2015 0443   PROTEINUR 30 (A) 11/03/2015 0443   NITRITE NEGATIVE 11/03/2015 0443   LEUKOCYTESUR NEGATIVE 11/03/2015 0443   Sepsis Labs: @LABRCNTIP (procalcitonin:4,lacticidven:4)  ) Recent Results (from the past 240 hour(s))  MRSA PCR Screening     Status: None   Collection Time: 11/03/15  8:22 AM  Result Value Ref Range Status   MRSA by PCR NEGATIVE NEGATIVE Final    Comment:        The GeneXpert MRSA Assay (FDA approved for NASAL specimens only), is one component of a comprehensive MRSA colonization surveillance program. It is not intended to diagnose MRSA infection nor to guide or monitor treatment for MRSA infections.          Radiology Studies: No results found.      Scheduled Meds: . mouth rinse  15 mL Mouth Rinse BID   Continuous Infusions:    LOS: 4 days    Time spent: 45 minutes. Greater than 50% of this time was spent in direct contact with the patient coordinating care.     Chaya Jan, MD [facility] Pager [phone]  If 7PM-7AM, please contact night-coverage [email or web address] Password [email or web address] 11/07/2015, 12:08 PM

## 2015-11-07 NOTE — Care Management Important Message (Signed)
Important Message  Patient Details  Name: Rondall AllegraMyrtle S Pilar MRN: [id number] Date of Birth: 07-Apr-1919   Medicare Important Message Given:  Yes    Malcolm MetroChildress, Glenisha Gundry Demske, RN 11/07/2015, 3:58 PM

## 2015-11-07 NOTE — Progress Notes (Signed)
Verlon AuLeslie, RN received report.  Sundra AlandMaura S Keiosha Cancro, RN

## 2015-11-07 NOTE — Progress Notes (Signed)
[place]a [place] A. Merlene Laughter, MD     [email or web address]          Tonya Henderson is an 82 y.o. female.   Assessment/Plan: Severe encephalopathy which I think is most likely coming from bilateral posterior circulation/PCA infarcts. 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 She is likely having symptoms of encephalopathy due to ischemia to brain stem structures. This can be a very serious condition resulting in severe neurological deficits and the high risk of mortality. For now we'll continue with the current supportive care with IV fluids. She is a DO NOT RESUSCITATE. Again, we'll repeat her neurological examination and continue to follow up with the family. Family is going over considerations for withdrawal of care/palliative care. They do not want artificial feeding at this time.     The patient is overall unchanged overnight. She again has a poor prognosis for functional outcome.  GENERAL: Unresponsive.  HEENT: Supple. Atraumatic normocephalic.   ABDOMEN: soft  EXTREMITIES: No edema   BACK: Normal.  SKIN: Normal by inspection.    MENTAL STATUS: She does moans and groans. She does open eyes minimally to painful stimuli.  CRANIAL NERVES: Pupils are equal, round and reactive to light; The eyes are deviated to the left but can be overcome pass midline with oculocephalic reflexes to about 80%; The patient is noted to have tonic movements of the eyes (which are deviated towards the left) to the midline and then deviates back to the left with a few beats of nystagmus. Vestibular ocular reflex using cold colorics in the right ear was given with the eye deviating towards the right. Upper and lower facial muscles are normal in strength and symmetric, there is no flattening of the nasolabial folds.  MOTOR: The patient withdraws to deep painful stimuli. Complete drift of all 4 extremities.  COORDINATION: Left  finger to nose is normal, right finger to nose is normal, No rest tremor; no intention tremor; no postural tremor; no bradykinesia.  REFLEXES: Deep tendon reflexes are symmetrical and normal.   SENSATION: Responsive the pain was similar bilaterally.        Objective: Vital signs in last 24 hours: Temp:  [97 F (36.1 C)-97.7 F (36.5 C)] 97.7 F (36.5 C) (10/04 0800) Pulse Rate:  [55-144] 127 (10/04 0800) Resp:  [13-30] 20 (10/04 0800) BP: (126-192)/(43-151) 192/88 (10/04 0800) SpO2:  [86 %-99 %] 94 % (10/04 0800) Weight:  [133 lb 6.1 oz (60.5 kg)] 133 lb 6.1 oz (60.5 kg) (10/04 0500)  Intake/Output from previous day: 10/03 0701 - 10/04 0700 In: 1580 [I.V.:1580] Out: 1000 [Urine:1000] Intake/Output this shift: Total I/O In: 150 [I.V.:150] Out: -  Nutritional status: Diet Heart Room service appropriate? Yes; Fluid consistency: Thin   Lab Results: Results for orders placed or performed during the hospital encounter of 11/03/15 (from the past 48 hour(s))  Glucose, capillary     Status: Abnormal   Collection Time: 11/05/15 11:42 AM  Result Value Ref Range   Glucose-Capillary 140 (H) 65 - 99 mg/dL   Comment 1 Notify RN    Comment 2 Document in Chart   Glucose, capillary     Status: Abnormal   Collection Time: 11/05/15  4:44 PM  Result Value Ref Range   Glucose-Capillary 135 (H) 65 - 99 mg/dL   Comment 1 Notify RN    Comment 2 Document in Chart   Heparin level (unfractionated)     Status: Abnormal   Collection Time:  11/05/15  5:53 PM  Result Value Ref Range   Heparin Unfractionated 0.77 (H) 0.30 - 0.70 IU/mL    Comment:        IF HEPARIN RESULTS ARE BELOW EXPECTED VALUES, AND PATIENT DOSAGE HAS BEEN CONFIRMED, SUGGEST FOLLOW UP TESTING OF ANTITHROMBIN III LEVELS.   Glucose, capillary     Status: Abnormal   Collection Time: 11/05/15  7:39 PM  Result Value Ref Range   Glucose-Capillary 153 (H) 65 - 99 mg/dL  Comprehensive metabolic panel     Status: Abnormal    Collection Time: 11/06/15  4:20 AM  Result Value Ref Range   Sodium 139 135 - 145 mmol/L   Potassium 3.4 (L) 3.5 - 5.1 mmol/L   Chloride 112 (H) 101 - 111 mmol/L   CO2 21 (L) 22 - 32 mmol/L   Glucose, Bld 164 (H) 65 - 99 mg/dL   BUN 34 (H) 6 - 20 mg/dL   Creatinine, Ser 1.25 (H) 0.44 - 1.00 mg/dL   Calcium 9.4 8.9 - 10.3 mg/dL   Total Protein 5.9 (L) 6.5 - 8.1 g/dL   Albumin 2.8 (L) 3.5 - 5.0 g/dL   AST 20 15 - 41 U/L   ALT 13 (L) 14 - 54 U/L   Alkaline Phosphatase 50 38 - 126 U/L   Total Bilirubin 1.0 0.3 - 1.2 mg/dL   GFR calc non Af Amer 36 (L) >60 mL/min   GFR calc Af Amer 41 (L) >60 mL/min    Comment: (NOTE) The eGFR has been calculated using the CKD EPI equation. This calculation has not been validated in all clinical situations. eGFR's persistently <60 mL/min signify possible Chronic Kidney Disease.    Anion gap 6 5 - 15  CBC     Status: Abnormal   Collection Time: 11/06/15  4:20 AM  Result Value Ref Range   WBC 16.7 (H) 4.0 - 10.5 K/uL   RBC 3.41 (L) 3.87 - 5.11 MIL/uL   Hemoglobin 9.9 (L) 12.0 - 15.0 g/dL   HCT 30.5 (L) 36.0 - 46.0 %   MCV 89.4 78.0 - 100.0 fL   MCH 29.0 26.0 - 34.0 pg   MCHC 32.5 30.0 - 36.0 g/dL   RDW 14.1 11.5 - 15.5 %   Platelets 225 150 - 400 K/uL  Heparin level (unfractionated)     Status: None   Collection Time: 11/06/15  4:20 AM  Result Value Ref Range   Heparin Unfractionated 0.32 0.30 - 0.70 IU/mL    Comment:        IF HEPARIN RESULTS ARE BELOW EXPECTED VALUES, AND PATIENT DOSAGE HAS BEEN CONFIRMED, SUGGEST FOLLOW UP TESTING OF ANTITHROMBIN III LEVELS.   Glucose, capillary     Status: Abnormal   Collection Time: 11/06/15  7:30 AM  Result Value Ref Range   Glucose-Capillary 135 (H) 65 - 99 mg/dL   Comment 1 Notify RN    Comment 2 Document in Chart   Glucose, capillary     Status: Abnormal   Collection Time: 11/06/15 11:21 AM  Result Value Ref Range   Glucose-Capillary 139 (H) 65 - 99 mg/dL   Comment 1 Notify RN     Comment 2 Document in Chart   Glucose, capillary     Status: Abnormal   Collection Time: 11/06/15  5:57 PM  Result Value Ref Range   Glucose-Capillary 133 (H) 65 - 99 mg/dL   Comment 1 Notify RN    Comment 2 Document in Chart   Glucose, capillary  Status: Abnormal   Collection Time: 11/06/15  9:18 PM  Result Value Ref Range   Glucose-Capillary 137 (H) 65 - 99 mg/dL  CBC     Status: Abnormal   Collection Time: 11/07/15  4:54 AM  Result Value Ref Range   WBC 11.9 (H) 4.0 - 10.5 K/uL   RBC 3.24 (L) 3.87 - 5.11 MIL/uL   Hemoglobin 9.4 (L) 12.0 - 15.0 g/dL   HCT 29.1 (L) 36.0 - 46.0 %   MCV 89.8 78.0 - 100.0 fL   MCH 29.0 26.0 - 34.0 pg   MCHC 32.3 30.0 - 36.0 g/dL   RDW 14.2 11.5 - 15.5 %   Platelets 200 150 - 400 K/uL    Lipid Panel No results for input(s): CHOL, TRIG, HDL, CHOLHDL, VLDL, LDLCALC in the last 72 hours.  Studies/Results:   Medications:  Scheduled Meds: . aspirin  300 mg Rectal Daily   Or  . aspirin  325 mg Oral Daily  . enoxaparin (LOVENOX) injection  60 mg Subcutaneous Q24H  . mouth rinse  15 mL Mouth Rinse BID   Continuous Infusions: . sodium chloride 75 mL/hr at 11/07/15 0600   PRN Meds:.metoprolol     LOS: 4 days   Claudette Wermuth A. Merlene Henderson, M.D.  Diplomate, Tax adviser of Psychiatry and Neurology ( Neurology).

## 2015-11-08 DIAGNOSIS — I63013 Cerebral infarction due to thrombosis of bilateral vertebral arteries: Secondary | ICD-10-CM

## 2015-11-08 LAB — GLUCOSE, CAPILLARY: Glucose-Capillary: 154 mg/dL — ABNORMAL HIGH (ref 65–99)

## 2015-11-08 MED ORDER — MORPHINE SULFATE (CONCENTRATE) 10 MG/0.5ML PO SOLN: 2.5000 mg | ORAL | 0 refills | Status: AC | PRN

## 2015-11-08 MED ORDER — LORAZEPAM 1 MG PO TABS: 1.0000 mg | ORAL_TABLET | ORAL | 0 refills | Status: AC | PRN

## 2015-12-05 NOTE — Discharge Summary (Addendum)
Physician Discharge Summary  Tonya Henderson Eades ZOX:[id number] DOB: [birth year] DOA: 11/03/2015  PCP: Pearson GrippeJames Kim, MD  Admit date: 11/03/2015 Discharge date: 11/05/2015  Time spent: 45 minutes  Recommendations for Outpatient Follow-up:  -Will be discharged to residential hospice today for end of life care.   Discharge Diagnoses:  Principal Problem:   Cerebrovascular accident (CVA) (HCC) Active Problems:   Atrial fibrillation with RVR (HCC)   HTN (hypertension)   History of congestive heart failure   Elevated troponin   Palliative care encounter   Goals of care, counseling/discussion CKD Stage III  Discharge Condition: Guarded  Filed Weights   11/05/15 0500 11/06/15 0440 11/07/15 0500  Weight: 59.2 kg (130 lb 8.2 oz) 60.3 kg (132 lb 15 oz) 60.5 kg (133 lb 6.1 oz)    History of present illness:  As per Dr. Conley RollsLe on 9/30: Tonya Henderson Tonya Henderson is an 82 y.o. female with hx of CHF, HTN, but generally doing well, lives at home with great family support, highly functional, presented to the ER as code stroke, as she woke up feeling malaise, having balance problems, and told her daughter she was n't feeling well.  EMS was summoned, and she was brought to the ER, by this time, obtunded.  She was not given TPA as ictus timing was not clear.  She improved in the ER, able to moves all 4 extremities, said good morning in a slurred speech, and answer rarely yes no questions.  She appears lethargic, and failed her swallow exam.  CT of her head showed old CVA, nothing acute, and EKG showed afib with RVR at 120.  Labs showed slight elevation of her Cr, CBG was normal.  Troponin slightly elevated at 0.12. She was given rectal ASA, and started on IV Cardiazem drip. EDP had spoken with family earlier, determined she is DNR. Her rate is now controlled at 90, SBP 120.  Hospitlist was asked to admit her for CVA work up and new onset afib, as far as we can determined, with RVR.     Hospital Course:   Multiple Acute  CVAs A fib with RVR HTN Elevated troponin  Family has elected to proceed with comfort care at a residential hospice facility. Will DC all  Medications per their request. Start ativan and roxanol as needed.   Procedures:  None   Consultations:  Neurology  Palliative CAre  Discharge Instructions     Medication List    STOP taking these medications   amLODipine 10 MG tablet Commonly known as:  NORVASC   aspirin EC 81 MG tablet   cyanocobalamin 1000 MCG/ML injection Commonly known as:  (VITAMIN B-12)   folic acid 1 MG tablet Commonly known as:  FOLVITE   hydrochlorothiazide 12.5 MG capsule Commonly known as:  MICROZIDE   levothyroxine 25 MCG tablet Commonly known as:  SYNTHROID, LEVOTHROID   olmesartan-hydrochlorothiazide 20-12.5 MG tablet Commonly known as:  BENICAR HCT   rosuvastatin 20 MG tablet Commonly known as:  CRESTOR     TAKE these medications   LORazepam 1 MG tablet Commonly known as:  ATIVAN Take 1 tablet (1 mg total) by mouth every 2 (two) hours as needed for anxiety.   morphine CONCENTRATE 10 MG/0.5ML Soln concentrated solution Place 0.13 mLs (2.6 mg total) under the tongue every 2 (two) hours as needed for severe pain (incresaed RR of 20 or greater).      No Known Allergies    The results of significant diagnostics from this hospitalization (including  imaging, microbiology, ancillary and laboratory) are listed below for reference.    Significant Diagnostic Studies: Ct Head Wo Contrast  Result Date: 11/03/2015 CLINICAL DATA:  82 y/o  F; unresponsive to painful stimulus. EXAM: CT HEAD WITHOUT CONTRAST TECHNIQUE: Contiguous axial images were obtained from the base of the skull through the vertex without intravenous contrast. COMPARISON:  None. FINDINGS: Brain: No evidence of large acute infarct, focal mass effect, intracranial hemorrhage, or hydrocephalus. There are mild chronic microvascular ischemic changes. Chronic left lentiform nucleus  and caudate head lacunar infarct and small lacunar infarcts in the right cerebellar hemisphere. Mild diffuse parenchymal volume loss. Vascular: Extensive calcific atherosclerosis of cavernous internal carotid arteries and vertebrobasilar system. Skull: Normal. Negative for fracture or focal lesion. Sinuses/Orbits: No acute finding. Bilateral intra-ocular lens replacement. Other: None. IMPRESSION: No acute intracranial abnormality is identified. Chronic left basal ganglia and right cerebellar hemisphere lacunar infarcts. Mild parenchymal volume loss and chronic microvascular ischemic changes for age. Electronically Signed   By: Mitzi Hansen M.D.   On: 11/03/2015 05:25   Mr Brain Wo Contrast  Result Date: 11/03/2015 CLINICAL DATA:  82 year old female found unresponsive. Initial encounter. EXAM: MRI HEAD WITHOUT CONTRAST TECHNIQUE: Multiplanar, multiecho pulse sequences of the brain and surrounding structures were obtained without intravenous contrast. COMPARISON:  Head CT without contrast 11/03/2015. FINDINGS: Brain: Restricted diffusion in both PCA territories, greater on the left and with confluent involvement from the mesial left temporal lobe through to the left occipital pole. There also appears to be linear restricted diffusion along the posterior corona radiata bilaterally. There are small foci of restricted diffusion in the thalami more so the right, which corresponds to PCA territory (thalamus striate branches). Scattered small foci of restricted diffusion in both cerebellar hemispheres, more so the right. The brainstem is spared. There is only minimal associated cytotoxic edema at this time. No definite acute hemorrhage. No intracranial mass effect. No midline shift, mass effect, or evidence of intracranial mass lesion. No ventriculomegaly. Patent basilar cisterns. Negative pituitary and cervicomedullary junction. Cerebral volume is within normal limits for age. There is a chronic lacunar  infarct in the left basal ganglia, but no underlying cortical encephalomalacia identified. Mild for age patchy nonspecific white matter changes elsewhere. Vascular: Major intracranial vascular flow voids are preserved. Skull and upper cervical spine: Negative. Visualized bone marrow signal is within normal limits. Sinuses/Orbits: Postoperative changes to the globes. Otherwise negative orbits soft tissues. Mild paranasal sinus mucosal thickening. Other: Grossly negative scalp soft tissues. IMPRESSION: 1. Multifocal acute infarcts in the brain, especially in both PCA territories. Confluent involvement of the left PCA territory. Multifocal thalamic involvement right greater than left also reflects PCA territory. Posterior corona radiata involvement which could be the distal PCA/MCA watershed. Superimposed scattered bilateral cerebellar artery territories. 2. This infarct pattern is most suggestive of a posterior circulation embolic event. Minimal edema at this time. No associated hemorrhage or mass effect. 3. Mild for age underlying chronic small vessel disease. Electronically Signed   By: Odessa Fleming M.D.   On: 11/03/2015 11:30   US Carotid Bilateral (at [facility] Only)  Result Date: 11/03/2015 CLINICAL DATA:  Stroke EXAM: BILATERAL CAROTID DUPLEX ULTRASOUND TECHNIQUE: Wallace Cullens scale imaging, color Doppler and duplex ultrasound were performed of bilateral carotid and vertebral arteries in the neck. COMPARISON:  None. FINDINGS: Criteria: Quantification of carotid stenosis is based on velocity parameters that correlate the residual internal carotid diameter with NASCET-based stenosis levels, using the diameter of the distal internal carotid lumen as the  denominator for stenosis measurement. The following velocity measurements were obtained: RIGHT ICA:  143 cm/sec CCA:  63 cm/sec SYSTOLIC ICA/CCA RATIO:  2.3 DIASTOLIC ICA/CCA RATIO:  3.1 ECA:  209 cm/sec LEFT ICA:  142 cm/sec CCA:  64 cm/sec SYSTOLIC ICA/CCA RATIO:  2.2  DIASTOLIC ICA/CCA RATIO:  1.1 ECA:  153 cm/sec RIGHT CAROTID ARTERY: Scattered calcified plaque in the common carotid. Extensive calcified plaque in the bulb which shadows the lumen of the internal carotid low resistance internal carotid Doppler pattern. RIGHT VERTEBRAL ARTERY:  Antegrade. LEFT CAROTID ARTERY: Extensive calcified plaque in the bulb. This shadows the lumen somewhat. Low resistance internal carotid Doppler pattern. LEFT VERTEBRAL ARTERY:  Antegrade. IMPRESSION: There is extensive calcified plaque in both bulbs somewhat limiting the examination. Based on peak systolic internal carotid velocities, estimated stenosis of the right and left internal carotid arteries is 50-69%. Electronically Signed   By: Jolaine Click M.D.   On: 11/03/2015 12:05   Dg Chest Port 1 View  Result Date: 11/03/2015 CLINICAL DATA:  Mental status change. Breathing problems. Unresponsive to painful stimuli. EXAM: PORTABLE CHEST 1 VIEW COMPARISON:  None. FINDINGS: Cardiac enlargement. No focal airspace disease or consolidation in the lungs. No blunting of costophrenic angles. No pneumothorax. Calcification of the aorta. Old bilateral rib fractures. IMPRESSION: Prominent cardiac enlargement. No evidence of active pulmonary disease. Electronically Signed   By: Burman Nieves M.D.   On: 11/03/2015 05:22    Microbiology: Recent Results (from the past 240 hour(Henderson))  MRSA PCR Screening     Status: None   Collection Time: 11/03/15  8:22 AM  Result Value Ref Range Status   MRSA by PCR NEGATIVE NEGATIVE Final    Comment:        The GeneXpert MRSA Assay (FDA approved for NASAL specimens only), is one component of a comprehensive MRSA colonization surveillance program. It is not intended to diagnose MRSA infection nor to guide or monitor treatment for MRSA infections.      Labs: Basic Metabolic Panel:  Recent Labs Lab 11/03/15 0443 11/04/15 0404 11/06/15 0420  NA 135 136 139  K 3.2* 3.4* 3.4*  CL 108 108  112*  CO2 21* 23 21*  GLUCOSE 172* 109* 164*  BUN 43* 32* 34*  CREATININE 1.63* 1.23* 1.25*  CALCIUM 9.5 9.3 9.4   Liver Function Tests:  Recent Labs Lab 11/03/15 0443 11/04/15 0404 11/06/15 0420  AST 14* 17 20  ALT 10* 10* 13*  ALKPHOS 57 54 50  BILITOT 0.6 0.6 1.0  PROT 6.5 5.9* 5.9*  ALBUMIN 3.3* 3.0* 2.8*   No results for input(Henderson): LIPASE, AMYLASE in the last 168 hours. No results for input(Henderson): AMMONIA in the last 168 hours. CBC:  Recent Labs Lab 11/03/15 0443 11/04/15 0404 11/06/15 0420 11/07/15 0454  WBC 8.1 9.4 16.7* 11.9*  NEUTROABS 3.4  --   --   --   HGB 10.4* 9.9* 9.9* 9.4*  HCT 31.9* 30.4* 30.5* 29.1*  MCV 88.6 88.4 89.4 89.8  PLT 203 213 225 200   Cardiac Enzymes:  Recent Labs Lab 11/03/15 0443 11/03/15 0823 11/03/15 1507 11/03/15 2108  TROPONINI 0.12* 0.11* 0.11* 0.12*   BNP: BNP (last 3 results) No results for input(Henderson): BNP in the last 8760 hours.  ProBNP (last 3 results) No results for input(Henderson): PROBNP in the last 8760 hours.  CBG:  Recent Labs Lab 11/05/15 1939 11/06/15 0730 11/06/15 1121 11/06/15 1757 11/06/15 2118  GLUCAP 153* 135* 139* 133* 137*  SignedChaya Jan  [facility] Pager: [phone] 2015/11/23, 10:59 AM

## 2015-12-05 NOTE — Care Management Note (Addendum)
Case Management Note  Patient Details  Name: Tonya Henderson MRN: [id number] Date of Birth: [birth year]  Pt with CVA. Pt's family working with palliative. Pt discharging to Hospice medical facility today. CSW is aware and has made referral to Hospice facility. No CM needs.   Expected Discharge Date:     11/29/2015             Expected Discharge Plan:  Hospice Medical Facility  In-House Referral:  Clinical Social Work, Hospice / Palliative Care  Discharge planning Services  CM Consult  Post Acute Care Choice:  [facility]  DME Arranged:    DME Agency:     HH Arranged:    HH Agency:     Status of Service:  Completed, signed off  If discussed at Long Length of Stay Meetings, dates discussed:  12/04/2015  Additional Comments:  Malcolm MetroChildress, Bich Mchaney Demske, RN 11/16/2015, 11:02 AM

## 2015-12-05 NOTE — Clinical Social Work Note (Addendum)
Patient Information   Patient Name Rondall Allegraerdue, Keionna S ([id number]) Sex Female DOB [birth year] SSN [id number]  Room Bed                   A320 A320-01  Patient Demographics   Address [address] [place] [address] Phone [phone] (Home)  Patient Ethnicity & Race   Ethnic Group Patient Race  Not Hispanic or Latino White or Caucasian  Emergency Contact(s)   Name Relation Home Work [place]  Miller,Sarah Daughter [phone]  [phone]  Smith,Rama Daughter [phone]    Ofilia NeasJessie,Wilma Daughter   [phone]  Documents on File    Status Date Received Description  Documents for the Patient  Historic Radiology Documentation  04/20/10   [place] HIPAA NOTICE OF PRIVACY - Scanned Received 11/04/10   [place] E-Signature HIPAA Notice of Privacy     [place] E-Signature HIPAA Notice of Privacy Spanish     Driver's License Not Received    Insurance Card Received 02/05/11   Advance Directives/Living Will/HCPOA/POA Not Received    Financial Application Not Received    Insurance Card  11/03/10   Insurance Card  11/04/10   [place] HIPAA NOTICE OF PRIVACY - Scanned  11/04/10   [place] HIPAA NOTICE OF PRIVACY - Scanned  11/04/10   [place] HIPAA NOTICE OF PRIVACY - Scanned  11/08/10   Release of Information  11/08/10   AMB Intake Forms/Questionnaires  11/12/10   AMB Correspondence  11/25/10 10/12 PT Order- [facility]  AMB Correspondence  12/27/10 11/12 Progress Note OT [facility]  AMB Correspondence  01/22/11 12/12 Rehab [facility]  Insurance Card  02/05/11   AMB Correspondence  12/05/10 10/12 Rehab [facility]   Other Photo ID Not Received    Insurance Card Received 11/03/15 [employer]  AMB Correspondence (Deleted) 12/05/10 10/12 Referral/Eval [facility]   Documents for the Encounter  AOB (Assignment of Insurance Benefits) Not Received    E-signature AOB Signed 11/03/15   MEDICARE RIGHTS Not Received    E-signature Medicare Rights Signed 11/03/15   ED Patient  Billing Extract   ED PB Summary  ED Patient Billing Extract   ED Encounter Summary  Cardiac Monitoring Strip  11/03/15   Ultrasound  11/03/15   Discharge Attachment   ATRIAL FIBRILLATION (ENGLISH)  Discharge Attachment   STROKE PREVENTION (ENGLISH)  Discharge Attachment   ISCHEMIC STROKE TREATED WITHOUT WARFARIN, EASY-TO-READ (ENGLISH)  ED Patient Billing Extract   ED PB Summary  Admission Information   Attending Provider Admitting Provider Admission Type Admission Date/Time  [facility] CloudEstela Y Hernandez Acosta, MD Haydee Monicaachal A David, MD Emergency 11/03/15 [phone]  Discharge Date Hospital Service Auth/Cert Status Service Area   Internal Medicine Incomplete  SERVICE AREA  Unit Room/Bed Admission Status   AP-DEPT 300 A320/A320-01 Admission (Confirmed)   Admission   Complaint  Cerebrovascular Accident  Hospital Account   Name Acct ID Class Status Primary Coverage  Rondall Allegraerdue, Tatjana S [id number] Inpatient Open UNITED HEALTHCARE MEDICARE [facility]      Guarantor Account (for Hospital Account [id number])   Name Relation to Pt Service Area Active? Acct Type  Rondall AllegraPerdue, Rashonda S Self CHSA Yes Personal/Family  Address Phone    [address] [place], [place] [address] [phone](H)        Coverage Information (for Hospital Account [id number])   F/O Payor/Plan Precert #  [facility]UHC MEDICARE   Subscriber Subscriber #  Rondall Allegraerdue, Antonae S [id number]  Address Phone  [address] [address] [place], [place] [id number] [phone]

## 2015-12-05 NOTE — Progress Notes (Signed)
[facility]Pt. Transported via EMS.

## 2015-12-05 NOTE — Clinical Social Work Note (Signed)
CSW [facility]. Family was at bedside.    CSW signing off.

## 2015-12-05 NOTE — Clinical Social Work Note (Signed)
CSW met with patient and multipile children and other relatives. CSW discussed Hospice Home referral.  Family was agreeable to [place] referral.  CSW faxed clinicals to [facility]. CSW spoke with Jinny Blossom at [facility] and arranged for Jinny Blossom to come meet with the family at noon today.    Ihor Gully,  [phone]

## 2015-12-05 NOTE — Progress Notes (Signed)
Daily Progress Note   Patient Name: Tonya Henderson       Date: 11/11/2015 DOB: 12-Oct-1919  Age: 82 y.o. MRN#: [id number] Attending Physician: No att. providers found Primary Care Physician: Pearson GrippeJames Kim, MD Admit Date: 11/03/2015  Reason for Consultation/Follow-up: Inpatient hospice referral and Psychosocial/spiritual support  Subjective: [name] is resting quietly in bed, surrounded by her family. She does not respond to me in any meaningful way. There are no signs and symptoms of distress such as increased respiratory rate or grimacing with touch. Offered support to family, we talk about mouth care. All questions answered.  Length of Stay: 5  Current Medications: Scheduled Meds:  . morphine CONCENTRATE  2.6 mg Sublingual Q4H    Continuous Infusions:    PRN Meds: LORazepam, morphine CONCENTRATE  Physical Exam  Constitutional: No distress.  HENT:  Head: Normocephalic and atraumatic.  Cardiovascular: Normal rate.   Pulmonary/Chest:  No respiratory distress, mouth open breathing.  Abdominal: Soft. She exhibits no distension.  Neurological:  Sedated, not responding to touch or command.  Skin: Skin is warm and dry.  Nursing note and vitals reviewed.           Vital Signs: BP (!) 169/103 (BP Location: Left Arm)   Pulse 69   Temp 97.6 F (36.4 C) (Axillary)   Resp (!) 24   Ht 5\' 4"  (1.626 m)   Wt 60.5 kg (133 lb 6.1 oz)   SpO2 90%   BMI 22.89 kg/m  SpO2: SpO2: 90 % O2 Device: O2 Device: Nasal Cannula O2 Flow Rate: O2 Flow Rate (L/min): 3 L/min  Intake/output summary:  Intake/Output Summary (Last 24 hours) at 2016-01-12 1432 Last data filed at 2016-01-12 0400  Gross per 24 hour  Intake                0 ml  Output              600 ml  Net             -600 ml   LBM:  Last BM Date: 11/03/15 Baseline Weight: Weight: 64.4 kg (142 lb) Most recent weight: Weight: 60.5 kg (133 lb 6.1 oz)       Palliative Assessment/Data:    Flowsheet Rows   Flowsheet Row Most Recent Value  Intake Tab  Referral  Department  Hospitalist  Unit at Time of Referral  ICU  Palliative Care Primary Diagnosis  Neurology  Date Notified  11/07/15  Palliative Care Type  New Palliative care  Date of Admission  11/03/15  Date first seen by Palliative Care  11/07/15  # of days Palliative referral response time  0 Day(s)  # of days IP prior to Palliative referral  4  Clinical Assessment  Palliative Performance Scale Score  20%  Pain Max last 24 hours  Not able to report  Pain Min Last 24 hours  Not able to report  Dyspnea Max Last 24 Hours  Not able to report  Dyspnea Min Last 24 hours  Not able to report  Psychosocial & Spiritual Assessment  Palliative Care Outcomes  Patient/Family meeting held?  Yes  Who was at the meeting?  multiple familty memebers   Palliative Care Outcomes  Provided advance care planning, Provided psychosocial or spiritual support, Provided end of life care assistance, Clarified goals of care  Patient/Family wishes: Interventions discontinued/not started   Mechanical Ventilation, Tube feedings/TPN  Palliative Care follow-up planned  -- [follow up while at [facility]]      Patient Active Problem List   Diagnosis Date Noted  . Palliative care encounter   . Goals of care, counseling/discussion   . Atrial fibrillation with RVR (HCC) 11/03/2015  . Cerebrovascular accident (CVA) (HCC) 11/03/2015  . HTN (hypertension) 11/03/2015  . History of congestive heart failure 11/03/2015  . Elevated troponin 11/03/2015  . Pain in joint, hand 11/26/2010  . Edema of upper extremity 11/26/2010  . Muscle weakness (generalized) 11/26/2010  . Colles' fracture 11/11/2010    Palliative Care Assessment & Plan   Patient Profile: 82 y.o. female  with past medical history of  CHF, high cholesterol, hypertension admitted on 11/03/2015 with CVA.   Assessment: Morphine and Ativan for symptom management, transition to [facility] today.  Recommendations/Plan:  transition to [facility] today  Goals of Care and Additional Recommendations:  Limitations on Scope of Treatment: Full Comfort Care  Code Status:    Code Status Orders        Start     Ordered   11/03/15 0805  Do not attempt resuscitation (DNR)  Continuous    Question Answer Comment  In the event of cardiac or respiratory ARREST Do not call a "code blue"   In the event of cardiac or respiratory ARREST Do not perform Intubation, CPR, defibrillation or ACLS   In the event of cardiac or respiratory ARREST Use medication by any route, position, wound care, and other measures to relive pain and suffering. May use oxygen, suction and manual treatment of airway obstruction as needed for comfort.      11/03/15 0804    Code Status History    Date Active Date Inactive Code Status Order ID Comments User Context   This patient has a current code status but no historical code status.    Advance Directive Documentation   Flowsheet Row Most Recent Value  Type of Advance Directive  Healthcare Power of Attorney [daughter- Sarah per pt family]  Pre-existing out of facility DNR order (yellow form or pink MOST form)  No data  "MOST" Form in Place?  No data       Prognosis:   Hours - Days  Discharge Planning:  [facility] today.  Care plan was discussed with nursing staff, case manager, social worker, and Dr. Viann Fish.  Thank you for  allowing the Palliative Medicine Team to assist in the care of this patient.   Time In: 1140 Time Out: 1155 Total Time 15 minutes  Prolonged Time Billed  no       Greater than 50%  of this time was spent counseling and coordinating care related to the above assessment and plan.  Katheran Awe, NP  Please  contact Palliative Medicine Team phone at [phone] for questions and concerns.

## 2015-12-05 DEATH — deceased

## 2015-12-19 ENCOUNTER — Encounter (HOSPITAL_COMMUNITY): Payer: Self-pay | Admitting: *Deleted

## 2016-01-10 DIAGNOSIS — G9340 Encephalopathy, unspecified: Secondary | ICD-10-CM
